# Patient Record
Sex: Male | Born: 1985
Health system: Southern US, Community
[De-identification: ages and names within clinical notes are randomized; demographics above are authoritative.]

## PROBLEM LIST (undated history)

## (undated) ENCOUNTER — Emergency Department (HOSPITAL_COMMUNITY): Admission: EM | Discharge status: Against Medical Advice | Payer: Self-pay

## (undated) DIAGNOSIS — I10 Essential (primary) hypertension: Secondary | ICD-10-CM

## (undated) DIAGNOSIS — R51 Headache: Secondary | ICD-10-CM

## (undated) DIAGNOSIS — I209 Angina pectoris, unspecified: Secondary | ICD-10-CM

## (undated) DIAGNOSIS — E0500 Thyrotoxicosis with diffuse goiter without thyrotoxic crisis or storm: Secondary | ICD-10-CM

## (undated) DIAGNOSIS — R519 Headache, unspecified: Secondary | ICD-10-CM

## (undated) DIAGNOSIS — J42 Unspecified chronic bronchitis: Secondary | ICD-10-CM

## (undated) HISTORY — PX: NO PAST SURGERIES: SHX2092

---
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
## 2011-11-03 ENCOUNTER — Emergency Department (HOSPITAL_COMMUNITY): Payer: Self-pay

## 2011-11-03 ENCOUNTER — Other Ambulatory Visit: Payer: Self-pay

## 2011-11-03 ENCOUNTER — Emergency Department (HOSPITAL_COMMUNITY)
Admission: EM | Admit: 2011-11-03 | Discharge: 2011-11-03 | Disposition: A | Discharge status: Home / Self Care | Payer: Self-pay | Attending: Emergency Medicine | Admitting: Emergency Medicine

## 2011-11-03 ENCOUNTER — Encounter (HOSPITAL_COMMUNITY): Payer: Self-pay

## 2011-11-03 DIAGNOSIS — R6889 Other general symptoms and signs: Secondary | ICD-10-CM | POA: Insufficient documentation

## 2011-11-03 DIAGNOSIS — E079 Disorder of thyroid, unspecified: Secondary | ICD-10-CM | POA: Insufficient documentation

## 2011-11-03 DIAGNOSIS — I10 Essential (primary) hypertension: Secondary | ICD-10-CM | POA: Insufficient documentation

## 2011-11-03 DIAGNOSIS — F17200 Nicotine dependence, unspecified, uncomplicated: Secondary | ICD-10-CM | POA: Insufficient documentation

## 2011-11-03 DIAGNOSIS — R071 Chest pain on breathing: Secondary | ICD-10-CM | POA: Insufficient documentation

## 2011-11-03 DIAGNOSIS — R059 Cough, unspecified: Secondary | ICD-10-CM | POA: Insufficient documentation

## 2011-11-03 DIAGNOSIS — J3489 Other specified disorders of nose and nasal sinuses: Secondary | ICD-10-CM | POA: Insufficient documentation

## 2011-11-03 DIAGNOSIS — R05 Cough: Secondary | ICD-10-CM | POA: Insufficient documentation

## 2011-11-03 MED ORDER — LORATADINE 10 MG PO TABS: 10.0000 mg | ORAL_TABLET | Freq: Every day | ORAL | Status: DC

## 2011-11-03 MED ORDER — ALBUTEROL SULFATE HFA 108 (90 BASE) MCG/ACT IN AERS
2.0000 | INHALATION_SPRAY | RESPIRATORY_TRACT | Status: DC | PRN
  Administered 2011-11-03: 2 via RESPIRATORY_TRACT
  Filled 2011-11-03: qty 6.7

## 2011-11-03 MED ORDER — KETOROLAC TROMETHAMINE 60 MG/2ML IM SOLN
60.0000 mg | Freq: Once | INTRAMUSCULAR | Status: AC
  Administered 2011-11-03: 60 mg via INTRAMUSCULAR
  Filled 2011-11-03: qty 2

## 2011-11-03 NOTE — ED Notes (Signed)
RRT annamarie made aware of pt's need

## 2011-11-03 NOTE — ED Provider Notes (Signed)
History     CSN: [id number]  Arrival date & time 11/03/11  1455   First MD Initiated Contact with Patient 11/03/11 1536      Chief Complaint  Patient presents with  . Chest Pain    (Consider location/radiation/quality/duration/timing/severity/associated sxs/prior treatment) HPI  The patient presents to the emergency department with complaints of chest pains for 1-2 years. He also complains of sneezing, nasal congestion, cough for the past week. The patient describes the chest pains are sometimes on the left sometimes on the right and sharp. He denies association of weakness, syncope, diaphoresis, shortness of breath. He denies having fevers, chills, nausea, vomiting, diarrhea.  Past Medical History  Diagnosis Date  . Thyroid disease     No past surgical history on file.  No family history on file.  History  Substance Use Topics  . Smoking status: Current Everyday Smoker -- 0.5 packs/day  . Smokeless tobacco: Not on file  . Alcohol Use: Yes     social      Review of Systems  All other systems reviewed and are negative.    Allergies  Review of patient's allergies indicates no known allergies.  Home Medications   Current Outpatient Rx  Name Route Sig Dispense Refill  . IBUPROFEN 200 MG PO TABS Oral Take 200 mg by mouth every 6 (six) hours as needed. For chest pain    . LORATADINE 10 MG PO TABS Oral Take 1 tablet (10 mg total) by mouth daily. 30 tablet 0    BP 156/109  Pulse 62  Temp(Src) 98.9 F (37.2 C) (Oral)  Resp 22  SpO2 99%  Physical Exam  Nursing note and vitals reviewed. Constitutional: He is oriented to person, place, and time. He appears well-developed and well-nourished. No distress.  HENT:  Head: Normocephalic and atraumatic.  Right Ear: External ear normal.  Left Ear: External ear normal.  Nose: Rhinorrhea present. Right sinus exhibits maxillary sinus tenderness. Left sinus exhibits maxillary sinus tenderness.  Mouth/Throat: Oropharynx is  clear and moist. No oropharyngeal exudate.  Eyes: EOM are normal. Pupils are equal, round, and reactive to light.  Neck: Normal range of motion.  Cardiovascular: Normal rate and regular rhythm.   Pulmonary/Chest: Effort normal and breath sounds normal. He has no wheezes. He has no rales. He exhibits tenderness (moderate tenderness to palpation of illustrated areas). He exhibits no bony tenderness, no laceration, no crepitus, no edema, no deformity, no swelling and no retraction.    Abdominal: Soft. Bowel sounds are normal. He exhibits no distension. There is no tenderness.  Musculoskeletal: Normal range of motion.  Neurological: He is oriented to person, place, and time.  Skin: Skin is warm and dry. He is not diaphoretic.    ED Course  Procedures (including critical care time)  Labs Reviewed - No data to display Dg Chest 2 View  11/03/2011  *RADIOLOGY REPORT*  Clinical Data: Cough, chest pain  CHEST - 2 VIEW  Comparison: None.  Findings: Borderline cardiomegaly noted.  No acute infiltrate or pleural effusion.  No pulmonary edema.  Bony thorax is unremarkable.  IMPRESSION: Borderline cardiomegaly.  No active disease.  Original Report Authenticated By: Natasha Mead, M.D.     1. Hypertension   2. Costochondral chest pain       MDM  Discussed pts blood pressure with patient. Pt has a history of Thyroid disease and hypertension. Pt has not seen a PCP in years. I am going to refer patient to a PCP for management of hypertension,  follow-up of thyroid concerns, and for continued evaluation and treatment of his chest pains.        Dorthula Matas, PA 11/03/11 1650

## 2011-11-03 NOTE — Discharge Instructions (Signed)
Hypertension Information As your heart beats, it forces blood through your arteries. This force is your blood pressure. If the pressure is too high, it is called hypertension (HTN) or high blood pressure. HTN is dangerous because you may have it and not know it. High blood pressure may mean that your heart has to work harder to pump blood. Your arteries may be narrow or stiff. The extra work puts you at risk for heart disease, stroke, and other problems.  Blood pressure consists of two numbers, a higher number over a lower, 110/72, for example. It is stated as "110 over 72." The ideal is below 120 for the top number (systolic) and under 80 for the bottom (diastolic).  You should pay close attention to your blood pressure if you have certain conditions such as:  Heart failure.   Prior heart attack.   Diabetes   Chronic kidney disease.   Prior stroke.   Multiple risk factors for heart disease.  To see if you have HTN, your blood pressure should be measured while you are seated with your arm held at the level of the heart. It should be measured at least twice. A one-time elevated blood pressure reading (especially in the Emergency Department) does not mean that you need treatment. There may be conditions in which the blood pressure is different between your right and left arms. It is important to see your caregiver soon for a recheck. Most people have essential hypertension which means that there is not a specific cause. This type of high blood pressure may be lowered by changing lifestyle factors such as:  Stress.   Smoking.   Lack of exercise.   Excessive weight.   Drug/tobacco/alcohol use.   Eating less salt.  Most people do not have symptoms from high blood pressure until it has caused damage to the body. Effective treatment can often prevent, delay or reduce that damage. TREATMENT  Treatment for high blood pressure, when a cause has been identified, is directed at the cause. There  are a large number of medications to treat HTN. These fall into several categories, and your caregiver will help you select the medicines that are best for you. Medications may have side effects. You should review side effects with your caregiver. If your blood pressure stays high after you have made lifestyle changes or started on medicines,   Your medication(s) may need to be changed.   Other problems may need to be addressed.   Be certain you understand your prescriptions, and know how and when to take your medicine.   Be sure to follow up with your caregiver within the time frame advised (usually within two weeks) to have your blood pressure rechecked and to review your medications.   If you are taking more than one medicine to lower your blood pressure, make sure you know how and at what times they should be taken. Taking two medicines at the same time can result in blood pressure that is too low.  Document Released: 10/31/2005 Document Revised: 05/10/2011 Document Reviewed: 11/07/2007 [facility] Patient Information 2012 [place], [place].Chest Pain (Nonspecific) It is often hard to give a specific diagnosis for the cause of chest pain. There is always a chance that your pain could be related to something serious, such as a heart attack or a blood clot in the lungs. You need to follow up with your caregiver for further evaluation. CAUSES   Heartburn.   Pneumonia or bronchitis.   Anxiety and stress.   Inflammation  around your heart (pericarditis) or lung (pleuritis or pleurisy).   A blood clot in the lung.   A collapsed lung (pneumothorax). It can develop suddenly on its own (spontaneous pneumothorax) or from injury (trauma) to the chest.  The chest wall is composed of bones, muscles, and cartilage. Any of these can be the source of the pain.  The bones can be bruised by injury.   The muscles or cartilage can be strained by coughing or overwork.   The cartilage can be affected by  inflammation and become sore (costochondritis).  DIAGNOSIS  Lab tests or other studies, such as X-rays, an EKG, stress testing, or cardiac imaging, may be needed to find the cause of your pain.  TREATMENT   Treatment depends on what may be causing your chest pain. Treatment may include:   Acid blockers for heartburn.   Anti-inflammatory medicine.   Pain medicine for inflammatory conditions.   Antibiotics if an infection is present.   You may be advised to change lifestyle habits. This includes stopping smoking and avoiding caffeine and chocolate.   You may be advised to keep your head raised (elevated) when sleeping. This reduces the chance of acid going backward from your stomach into your esophagus.   Most of the time, nonspecific chest pain will improve within 2 to 3 days with rest and mild pain medicine.  HOME CARE INSTRUCTIONS   If antibiotics were prescribed, take the full amount even if you start to feel better.   For the next few days, avoid physical activities that bring on chest pain. Continue physical activities as directed.   Do not smoke cigarettes or drink alcohol until your symptoms are gone.   Only take over-the-counter or prescription medicine for pain, discomfort, or fever as directed by your caregiver.   Follow your caregiver's suggestions for further testing if your chest pain does not go away.   Keep any follow-up appointments you made. If you do not go to an appointment, you could develop lasting (chronic) problems with pain. If there is any problem keeping an appointment, you must call to reschedule.  SEEK MEDICAL CARE IF:   You think you are having problems from the medicine you are taking. Read your medicine instructions carefully.   Your chest pain does not go away, even after treatment.   You develop a rash with blisters on your chest.  SEEK IMMEDIATE MEDICAL CARE IF:   You have increased chest pain or pain that spreads to your arm, neck, jaw,  back, or belly (abdomen).   You develop shortness of breath, an increasing cough, or you are coughing up blood.   You have severe back or abdominal pain, feel sick to your stomach (nauseous) or throw up (vomit).   You develop severe weakness, fainting, or chills.   You have an oral temperature above 102 F (38.9 C), not controlled by medicine.  THIS IS AN EMERGENCY. Do not wait to see if the pain will go away. Get medical help at once. Call your local emergency services (911 in [place] Do not drive yourself to the hospital. MAKE SURE YOU:   Understand these instructions.   Will watch your condition.   Will get help right away if you are not doing well or get worse.  Document Released: 06/07/2005 Document Revised: 05/10/2011 Document Reviewed: 04/02/2008 [facility] Patient Information 2012 [place], [place].

## 2011-11-03 NOTE — ED Notes (Signed)
Patient reports that he has had chestpain x 1 year that he describes as sharp in nature. Reports worse with inspiration

## 2011-11-04 NOTE — ED Provider Notes (Signed)
Medical screening examination/treatment/procedure(s) were performed by non-physician practitioner and as supervising physician I was immediately available for consultation/collaboration.  Jonelle Bann T Izic Stfort, MD 11/04/11 2027 

## 2012-09-05 ENCOUNTER — Emergency Department (HOSPITAL_COMMUNITY)
Admission: EM | Admit: 2012-09-05 | Discharge: 2012-09-06 | Discharge status: Against Medical Advice | Payer: Self-pay | Attending: Emergency Medicine | Admitting: Emergency Medicine

## 2012-09-05 ENCOUNTER — Emergency Department (HOSPITAL_COMMUNITY): Payer: Self-pay

## 2012-09-05 ENCOUNTER — Encounter (HOSPITAL_COMMUNITY): Payer: Self-pay | Admitting: Emergency Medicine

## 2012-09-05 DIAGNOSIS — S31119A Laceration without foreign body of abdominal wall, unspecified quadrant without penetration into peritoneal cavity, initial encounter: Secondary | ICD-10-CM

## 2012-09-05 DIAGNOSIS — S31109A Unspecified open wound of abdominal wall, unspecified quadrant without penetration into peritoneal cavity, initial encounter: Secondary | ICD-10-CM | POA: Insufficient documentation

## 2012-09-05 DIAGNOSIS — F17200 Nicotine dependence, unspecified, uncomplicated: Secondary | ICD-10-CM | POA: Insufficient documentation

## 2012-09-05 DIAGNOSIS — Z862 Personal history of diseases of the blood and blood-forming organs and certain disorders involving the immune mechanism: Secondary | ICD-10-CM | POA: Insufficient documentation

## 2012-09-05 DIAGNOSIS — Z8639 Personal history of other endocrine, nutritional and metabolic disease: Secondary | ICD-10-CM | POA: Insufficient documentation

## 2012-09-05 LAB — COMPREHENSIVE METABOLIC PANEL
ALT: 22 U/L (ref 0–53)
Alkaline Phosphatase: 49 U/L (ref 39–117)
BUN: 13 mg/dL (ref 6–23)
CO2: 18 mEq/L — ABNORMAL LOW (ref 19–32)
Chloride: 99 mEq/L (ref 96–112)
GFR calc Af Amer: >90 mL/min (ref >90)
GFR calc non Af Amer: 86 mL/min — ABNORMAL LOW (ref >90)
Glucose, Bld: 119 mg/dL — ABNORMAL HIGH (ref 70–99)
Potassium: 3.2 mEq/L — ABNORMAL LOW (ref 3.5–5.1)
Sodium: 139 mEq/L (ref 135–145)
Total Bilirubin: 0.7 mg/dL (ref 0.3–1.2)

## 2012-09-05 LAB — TYPE AND SCREEN
ABO/RH(D): A POS
Antibody Screen: NEGATIVE
Unit division: 0

## 2012-09-05 LAB — URINALYSIS, ROUTINE W REFLEX MICROSCOPIC
Bilirubin Urine: NEGATIVE
Ketones, ur: NEGATIVE mg/dL
Nitrite: NEGATIVE
Specific Gravity, Urine: 1.012 (ref 1.005–1.030)
Urobilinogen, UA: 0.2 mg/dL (ref 0.0–1.0)
pH: 6 (ref 5.0–8.0)

## 2012-09-05 LAB — URINE MICROSCOPIC-ADD ON

## 2012-09-05 LAB — CBC
MCHC: 35.2 g/dL (ref 30.0–36.0)
Platelets: 209 10*3/uL (ref 150–400)
RDW: 13.2 % (ref 11.5–15.5)
WBC: 5 10*3/uL (ref 4.0–10.5)

## 2012-09-05 LAB — PROTIME-INR: INR: 1.01 (ref 0.00–1.49)

## 2012-09-05 LAB — POCT I-STAT, CHEM 8
Calcium, Ion: 1.1 mmol/L — ABNORMAL LOW (ref 1.12–1.23)
Chloride: 106 mEq/L (ref 96–112)
Creatinine, Ser: 1.4 mg/dL — ABNORMAL HIGH (ref 0.50–1.35)
Glucose, Bld: 119 mg/dL — ABNORMAL HIGH (ref 70–99)
HCT: 45 % (ref 39.0–52.0)
Potassium: 3.3 mEq/L — ABNORMAL LOW (ref 3.5–5.1)

## 2012-09-05 LAB — ABO/RH: ABO/RH(D): A POS

## 2012-09-05 LAB — ETHANOL: Alcohol, Ethyl (B): 82 mg/dL — ABNORMAL HIGH (ref 0–11)

## 2012-09-05 MED ORDER — HYDROCODONE-ACETAMINOPHEN 5-325 MG PO TABS: 1.0000 | ORAL_TABLET | ORAL | Status: DC | PRN

## 2012-09-05 MED ORDER — HYDROCODONE-ACETAMINOPHEN 5-325 MG PO TABS
2.0000 | ORAL_TABLET | Freq: Once | ORAL | Status: AC
  Administered 2012-09-06: 2 via ORAL
  Filled 2012-09-05: qty 2

## 2012-09-05 MED ORDER — IBUPROFEN 600 MG PO TABS: 600.0000 mg | ORAL_TABLET | Freq: Four times a day (QID) | ORAL | Status: DC | PRN

## 2012-09-05 MED ORDER — LACTATED RINGERS IV BOLUS (SEPSIS)
2000.0000 mL | Freq: Once | INTRAVENOUS | Status: AC
  Administered 2012-09-05: 1000 mL via INTRAVENOUS

## 2012-09-05 MED ORDER — MORPHINE SULFATE 4 MG/ML IJ SOLN: 4.0000 mg | Freq: Once | INTRAMUSCULAR | Status: DC

## 2012-09-05 MED ORDER — CEFAZOLIN SODIUM 1-5 GM-% IV SOLN
1.0000 g | Freq: Once | INTRAVENOUS | Status: AC
  Administered 2012-09-05: 1 g via INTRAVENOUS
  Filled 2012-09-05: qty 50

## 2012-09-05 MED ORDER — SODIUM CHLORIDE 0.9 % IV BOLUS (SEPSIS)
1000.0000 mL | Freq: Once | INTRAVENOUS | Status: AC
  Administered 2012-09-05: 1000 mL via INTRAVENOUS

## 2012-09-05 MED ORDER — MORPHINE SULFATE 4 MG/ML IJ SOLN
4.0000 mg | Freq: Once | INTRAMUSCULAR | Status: AC
  Administered 2012-09-05: 4 mg via INTRAVENOUS
  Filled 2012-09-05: qty 1

## 2012-09-05 MED ORDER — IOHEXOL 300 MG/ML  SOLN
80.0000 mL | Freq: Once | INTRAMUSCULAR | Status: AC | PRN
  Administered 2012-09-05: 80 mL via INTRAVENOUS

## 2012-09-05 MED ORDER — FENTANYL CITRATE 0.05 MG/ML IJ SOLN: 50.0000 ug | Freq: Once | INTRAMUSCULAR | Status: DC

## 2012-09-05 MED ORDER — FENTANYL CITRATE 0.05 MG/ML IJ SOLN
INTRAMUSCULAR | Status: AC
  Filled 2012-09-05: qty 2

## 2012-09-05 MED ORDER — FENTANYL CITRATE 0.05 MG/ML IJ SOLN
INTRAMUSCULAR | Status: AC | PRN
  Administered 2012-09-05: 50 ug via INTRAVENOUS

## 2012-09-05 MED ORDER — CEPHALEXIN 500 MG PO CAPS: 500.0000 mg | ORAL_CAPSULE | Freq: Four times a day (QID) | ORAL | Status: DC

## 2012-09-05 NOTE — ED Notes (Signed)
Pt requested Nurse to call family member to let them know he is in the ED

## 2012-09-05 NOTE — ED Notes (Signed)
Family contacted. Per pt request, informed pt was in the ED

## 2012-09-05 NOTE — ED Notes (Signed)
Wound irrigated and redressed.
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
## 2012-09-05 NOTE — ED Notes (Signed)
Pt up walking around in room, states he is wanting to leave. MD notified. MD now in room explaining to patient the risk of leaving against medical advice. Pt instructed of the of the risks and benefits of leaving against medical advice. Pt states he understands.
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
## 2012-09-05 NOTE — ED Notes (Signed)
Family called, no answer.
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
## 2013-06-14 ENCOUNTER — Emergency Department (HOSPITAL_COMMUNITY)
Admission: EM | Admit: 2013-06-14 | Discharge: 2013-06-14 | Disposition: A | Discharge status: Home / Self Care | Payer: Self-pay | Attending: Emergency Medicine | Admitting: Emergency Medicine

## 2013-06-14 ENCOUNTER — Emergency Department (HOSPITAL_COMMUNITY): Payer: Self-pay

## 2013-06-14 ENCOUNTER — Encounter (HOSPITAL_COMMUNITY): Payer: Self-pay | Admitting: *Deleted

## 2013-06-14 DIAGNOSIS — I10 Essential (primary) hypertension: Secondary | ICD-10-CM | POA: Insufficient documentation

## 2013-06-14 DIAGNOSIS — F17200 Nicotine dependence, unspecified, uncomplicated: Secondary | ICD-10-CM | POA: Insufficient documentation

## 2013-06-14 DIAGNOSIS — Z8639 Personal history of other endocrine, nutritional and metabolic disease: Secondary | ICD-10-CM | POA: Insufficient documentation

## 2013-06-14 DIAGNOSIS — S0101XA Laceration without foreign body of scalp, initial encounter: Secondary | ICD-10-CM

## 2013-06-14 DIAGNOSIS — S060X9A Concussion with loss of consciousness of unspecified duration, initial encounter: Secondary | ICD-10-CM | POA: Insufficient documentation

## 2013-06-14 DIAGNOSIS — S0100XA Unspecified open wound of scalp, initial encounter: Secondary | ICD-10-CM | POA: Insufficient documentation

## 2013-06-14 DIAGNOSIS — S0990XA Unspecified injury of head, initial encounter: Secondary | ICD-10-CM

## 2013-06-14 DIAGNOSIS — Z862 Personal history of diseases of the blood and blood-forming organs and certain disorders involving the immune mechanism: Secondary | ICD-10-CM | POA: Insufficient documentation

## 2013-06-14 HISTORY — DX: Essential (primary) hypertension: I10

## 2013-06-14 MED ORDER — MORPHINE SULFATE 4 MG/ML IJ SOLN
6.0000 mg | INTRAMUSCULAR | Status: AC
  Administered 2013-06-14: 6 mg via INTRAMUSCULAR
  Filled 2013-06-14 (×2): qty 2

## 2013-06-14 MED ORDER — HYDROCODONE-ACETAMINOPHEN 5-325 MG PO TABS
2.0000 | ORAL_TABLET | Freq: Once | ORAL | Status: AC
  Administered 2013-06-14: 2 via ORAL
  Filled 2013-06-14: qty 2

## 2013-06-14 MED ORDER — LIDOCAINE HCL (PF) 1 % IJ SOLN
30.0000 mL | Freq: Once | INTRAMUSCULAR | Status: DC
  Filled 2013-06-14: qty 30

## 2013-06-14 MED ORDER — HYDROCODONE-ACETAMINOPHEN 5-325 MG PO TABS: 2.0000 | ORAL_TABLET | ORAL | Status: DC | PRN
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
## 2013-07-07 ENCOUNTER — Encounter (HOSPITAL_COMMUNITY): Payer: Self-pay | Admitting: Emergency Medicine

## 2013-07-07 ENCOUNTER — Emergency Department (HOSPITAL_COMMUNITY)
Admission: EM | Admit: 2013-07-07 | Discharge: 2013-07-07 | Disposition: A | Discharge status: Home / Self Care | Payer: Self-pay | Attending: Emergency Medicine | Admitting: Emergency Medicine

## 2013-07-07 DIAGNOSIS — Z8639 Personal history of other endocrine, nutritional and metabolic disease: Secondary | ICD-10-CM | POA: Insufficient documentation

## 2013-07-07 DIAGNOSIS — Z4802 Encounter for removal of sutures: Secondary | ICD-10-CM | POA: Insufficient documentation

## 2013-07-07 DIAGNOSIS — I10 Essential (primary) hypertension: Secondary | ICD-10-CM | POA: Insufficient documentation

## 2013-07-07 DIAGNOSIS — Z862 Personal history of diseases of the blood and blood-forming organs and certain disorders involving the immune mechanism: Secondary | ICD-10-CM | POA: Insufficient documentation

## 2013-07-07 DIAGNOSIS — F17200 Nicotine dependence, unspecified, uncomplicated: Secondary | ICD-10-CM | POA: Insufficient documentation

## 2013-07-07 MED ORDER — IBUPROFEN 400 MG PO TABS
800.0000 mg | ORAL_TABLET | Freq: Once | ORAL | Status: AC
  Administered 2013-07-07: 800 mg via ORAL
  Filled 2013-07-07: qty 2

## 2013-07-07 NOTE — ED Notes (Signed)
Patient just needing suture removal.
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
## 2013-07-08 NOTE — ED Provider Notes (Signed)
Medical screening examination/treatment/procedure(s) were performed by non-physician practitioner and as supervising physician I was immediately available for consultation/collaboration.  EKG Interpretation   None         Shelda Jakes, MD 07/08/13 [phone]

## 2015-02-25 ENCOUNTER — Encounter (HOSPITAL_COMMUNITY): Payer: Self-pay | Admitting: Physical Medicine and Rehabilitation

## 2015-02-25 ENCOUNTER — Emergency Department (HOSPITAL_COMMUNITY)
Admission: EM | Admit: 2015-02-25 | Discharge: 2015-02-25 | Disposition: A | Discharge status: Home / Self Care | Payer: Self-pay | Attending: Emergency Medicine | Admitting: Emergency Medicine

## 2015-02-25 DIAGNOSIS — I10 Essential (primary) hypertension: Secondary | ICD-10-CM | POA: Insufficient documentation

## 2015-02-25 DIAGNOSIS — S20469A Insect bite (nonvenomous) of unspecified back wall of thorax, initial encounter: Secondary | ICD-10-CM | POA: Insufficient documentation

## 2015-02-25 DIAGNOSIS — Z720 Tobacco use: Secondary | ICD-10-CM | POA: Insufficient documentation

## 2015-02-25 DIAGNOSIS — S60561A Insect bite (nonvenomous) of right hand, initial encounter: Secondary | ICD-10-CM | POA: Insufficient documentation

## 2015-02-25 DIAGNOSIS — Y9389 Activity, other specified: Secondary | ICD-10-CM | POA: Insufficient documentation

## 2015-02-25 DIAGNOSIS — S30861A Insect bite (nonvenomous) of abdominal wall, initial encounter: Secondary | ICD-10-CM | POA: Insufficient documentation

## 2015-02-25 DIAGNOSIS — Y9289 Other specified places as the place of occurrence of the external cause: Secondary | ICD-10-CM | POA: Insufficient documentation

## 2015-02-25 DIAGNOSIS — S90861A Insect bite (nonvenomous), right foot, initial encounter: Secondary | ICD-10-CM | POA: Insufficient documentation

## 2015-02-25 DIAGNOSIS — S90561A Insect bite (nonvenomous), right ankle, initial encounter: Secondary | ICD-10-CM | POA: Insufficient documentation

## 2015-02-25 DIAGNOSIS — Y998 Other external cause status: Secondary | ICD-10-CM | POA: Insufficient documentation

## 2015-02-25 DIAGNOSIS — W57XXXA Bitten or stung by nonvenomous insect and other nonvenomous arthropods, initial encounter: Secondary | ICD-10-CM | POA: Insufficient documentation

## 2015-02-25 DIAGNOSIS — S60562A Insect bite (nonvenomous) of left hand, initial encounter: Secondary | ICD-10-CM | POA: Insufficient documentation

## 2015-02-25 DIAGNOSIS — Z8639 Personal history of other endocrine, nutritional and metabolic disease: Secondary | ICD-10-CM | POA: Insufficient documentation

## 2015-02-25 DIAGNOSIS — S90862A Insect bite (nonvenomous), left foot, initial encounter: Secondary | ICD-10-CM | POA: Insufficient documentation

## 2015-02-25 DIAGNOSIS — S90562A Insect bite (nonvenomous), left ankle, initial encounter: Secondary | ICD-10-CM | POA: Insufficient documentation

## 2015-02-25 MED ORDER — HYDROXYZINE HCL 25 MG PO TABS: 25.0000 mg | ORAL_TABLET | Freq: Four times a day (QID) | ORAL | Status: DC

## 2015-02-25 MED ORDER — PERMETHRIN 5 % EX CREA: TOPICAL_CREAM | CUTANEOUS | Status: DC

## 2015-02-25 MED ORDER — HYDROXYZINE HCL 25 MG PO TABS
50.0000 mg | ORAL_TABLET | Freq: Once | ORAL | Status: AC
  Administered 2015-02-25: 50 mg via ORAL
  Filled 2015-02-25: qty 2

## 2015-02-25 MED ORDER — DEXAMETHASONE SODIUM PHOSPHATE 10 MG/ML IJ SOLN
10.0000 mg | Freq: Once | INTRAMUSCULAR | Status: AC
  Administered 2015-02-25: 10 mg via INTRAMUSCULAR
  Filled 2015-02-25: qty 1

## 2015-02-25 NOTE — ED Notes (Signed)
Pt presents to department for evaluation of rash to entire body. Reports he recently just moved into new apartment, concerned for bed bugs. NAD

## 2015-02-25 NOTE — ED Provider Notes (Signed)
CSN: [id number]     Arrival date & time 02/25/15  1237 History   This chart is scribed for non-physician practitioner, Jaynie Crumble, PA-C, working with Vanetta Mulders, MD by Abel Presto, ED Scribe.  This patient was seen in room TR08C/TR08C and the patient's care was started 1:21 PM.     Chief Complaint  Patient presents with  . Insect Bite     The history is provided by the patient. No language interpreter was used.   HPI Comments: Darren Barajas is a 29 y.o. male who presents to the Emergency Department complaining of pruritic rash to generalized body especially back, bilateral extremities and in between fingers.  Pt states he moved into a new apartment and slept on floor as he had not moved his furniture yet. Pt states he thinks he saw bed bugs. He reports associated swelling to ankles where a large number of bites are concentrated and generalized weakness.  He tried OTC cream for relief. He denies fever and chills.  Past Medical History  Diagnosis Date  . Thyroid disease   . Hypertension    History reviewed. No pertinent past surgical history. History reviewed. No pertinent family history. History  Substance Use Topics  . Smoking status: Current Every Day Smoker -- 0.50 packs/day    Types: Cigarettes  . Smokeless tobacco: Not on file  . Alcohol Use: Yes     Comment: social    Review of Systems  Constitutional: Negative for fever and chills.  Skin: Positive for rash.      Allergies  Review of patient's allergies indicates no known allergies.  Home Medications   Prior to Admission medications   Medication Sig Start Date End Date Taking? Authorizing Provider  HYDROcodone-acetaminophen (NORCO) 5-325 MG per tablet Take 2 tablets by mouth every 4 (four) hours as needed for pain. 06/14/13   Blane Ohara, MD   BP 164/101 mmHg  Pulse 82  Temp(Src) 98.4 F (36.9 C) (Oral)  Resp 18  SpO2 95% Physical Exam  Constitutional: He is oriented to person, place,  and time. He appears well-developed and well-nourished.  HENT:  Head: Normocephalic.  Eyes: Conjunctivae are normal.  Neck: Normal range of motion. Neck supple.  Pulmonary/Chest: Effort normal.  Musculoskeletal: Normal range of motion.  Neurological: He is alert and oriented to person, place, and time.  Skin: Skin is warm and dry.  Multiple erythematous papules to bilateral hands, feet, ankles, lower abdomen, lower back, bilateral flank. Multiple excoriations  Psychiatric: He has a normal mood and affect. His behavior is normal.  Nursing note and vitals reviewed.   ED Course  Procedures (including critical care time) DIAGNOSTIC STUDIES: Oxygen Saturation is 95% on room air, normal by my interpretation.    COORDINATION OF CARE: 1:25 PM Discussed treatment plan with patient at beside, the patient agrees with the plan and has no further questions at this time.   Labs Review Labs Reviewed - No data to display  Imaging Review No results found.   EKG Interpretation None      MDM   Final diagnoses:  Insect bites   Pt with multiple bug bites. Most likely bed bugs after sleeping in new apt. Pt already got rid of all the clothes he had there, pillow, sheets. Will give permethrin for possible scabies. Home with vistaril for itching. Pt given decadron in ed im because of severe itching.   Filed Vitals:   02/25/15 1249 02/25/15 1344  BP: 164/101 166/99  Pulse: 82 53  Temp: 98.4 F (36.9 C) 98.4 F (36.9 C)  TempSrc: Oral   Resp: 18 16  SpO2: 95% 100%   I personally performed the services described in this documentation, which was scribed in my presence. The recorded information has been reviewed and is accurate.   Jaynie Crumble, PA-C 02/25/15 1354  Vanetta Mulders, MD 02/26/15 [phone]

## 2015-02-25 NOTE — Discharge Instructions (Signed)
Apply topical benadryl. Take hydroxyzine for itching. Make sure to get rid of any clothes that you had on at new apt. Follow up with your doctor as needed   Bedbugs Bedbugs are tiny bugs that live in and around beds. During the day, they hide in mattresses and other places near beds. They come out at night and bite people lying in bed. They need blood to live and grow. Bedbugs can be found in beds anywhere. Usually, they are found in places where many people come and go (hotels, shelters, hospitals). It does not matter whether the place is dirty or clean. Getting bitten by bedbugs rarely causes a medical problem. The biggest problem can be getting rid of them. This often takes the work of a Oncologist. CAUSES  Less use of pesticides. Bedbugs were common before the 1950s. Then, strong pesticides such as DDT nearly wiped them out. Today, these pesticides are not used because they harm the environment and can cause health problems.  More travel. Besides mattresses, bedbugs can also live in clothing and luggage. They can come along as people travel from place to place. Bedbugs are more common in certain parts of the world. When people travel to those areas, the bugs can come home with them.  Presence of birds and bats. Bedbugs often infest birds and bats. If you have these animals in or near your home, bedbugs may infest your house, too. SYMPTOMS It does not hurt to be bitten by a bedbug. You will probably not wake up when you are bitten. Bedbugs usually bite areas of the skin that are not covered. Symptoms may show when you wake up, or they may take a day or more to show up. Symptoms may include:  Small red bumps on the skin. These might be lined up in a row or clustered in a group.  A darker red dot in the middle of red bumps.  Blisters on the skin. There may be swelling and very bad itching. These may be signs of an allergic reaction. This does not happen often. DIAGNOSIS Bedbug bites  might look and feel like other types of insect bites. The bugs do not stay on the body like ticks or lice. They bite, drop off, and crawl away to hide. Your caregiver will probably:  Ask about your symptoms.  Ask about your recent activities and travel.  Check your skin for bedbug bites.  Ask you to check at home for signs of bedbugs. You should look for:  Spots or stains on the bed or nearby. This could be from bedbugs that were crushed or from their eggs or waste.  Bedbugs themselves. They are reddish-brown, oval, and flat. They do not fly. They are about the size of an apple seed.  Places to look for bedbugs include:  Beds. Check mattresses, headboards, box springs, and bed frames.  On drapes and curtains near the bed.  Under carpeting in the bedroom.  Behind electrical outlets.  Behind any wallpaper that is peeling.  Inside luggage. TREATMENT Most bedbug bites do not need treatment. They usually go away on their own in a few days. The bites are not dangerous. However, treatment may be needed if you have scratched so much that your skin has become infected. You may also need treatment if you are allergic to bedbug bites. Treatment options include:  A drug that stops swelling and itching (corticosteroid). Usually, a cream is rubbed on the skin. If you have a bad rash, you  may be given a corticosteroid pill.  Oral antihistamines. These are pills to help control itching.  Antibiotic medicines. An antibiotic may be prescribed for infected skin. HOME CARE INSTRUCTIONS   Take any medicine prescribed by your caregiver for your bites. Follow the directions carefully.  Consider wearing pajamas with long sleeves and pant legs.  Your bedroom may need to be treated. A pest control expert should make sure the bedbugs are gone. You may need to throw away mattresses or luggage. Ask the pest control expert what you can do to keep the bedbugs from coming back. Common suggestions  include:  Putting a plastic cover over your mattress.  Washing and drying your clothes and bedding in hot water and a hot dryer. The temperature should be hotter than 120 F (48.9 C). Bedbugs are killed by high temperatures.  Vacuuming carefully all around your bed. Vacuum in all cracks and crevices where the bugs might hide. Do this often.  Carefully checking all used furniture, bedding, or clothes that you bring into your house.  Eliminating bird nests and bat roosts.  If you get bedbug bites when traveling, check all your possessions carefully before bringing them into your house. If you find any bugs on clothes or in your luggage, consider throwing those items away. SEEK MEDICAL CARE IF:  You have red bug bites that keep coming back.  You have red bug bites that itch badly.  You have bug bites that cause a skin rash.  You have scratch marks that are red and sore. SEEK IMMEDIATE MEDICAL CARE IF: You have a fever. Document Released: 09/30/2010 Document Revised: 11/20/2011 Document Reviewed: 09/30/2010 [facility] Patient Information 2015 [place], [place]. This information is not intended to replace advice given to you by your health care provider. Make sure you discuss any questions you have with your health care provider.

## 2015-04-07 ENCOUNTER — Encounter (HOSPITAL_COMMUNITY): Payer: Self-pay

## 2015-04-07 ENCOUNTER — Emergency Department (HOSPITAL_COMMUNITY): Payer: Self-pay

## 2015-04-07 ENCOUNTER — Inpatient Hospital Stay (HOSPITAL_COMMUNITY)
Admission: EM | Admit: 2015-04-07 | Discharge: 2015-04-08 | DRG: 641 | Disposition: A | Discharge status: Home / Self Care | Payer: Self-pay | Attending: Internal Medicine | Admitting: Internal Medicine

## 2015-04-07 DIAGNOSIS — S8001XA Contusion of right knee, initial encounter: Secondary | ICD-10-CM | POA: Diagnosis present

## 2015-04-07 DIAGNOSIS — Y929 Unspecified place or not applicable: Secondary | ICD-10-CM

## 2015-04-07 DIAGNOSIS — E8729 Other acidosis: Secondary | ICD-10-CM

## 2015-04-07 DIAGNOSIS — T43624A Poisoning by amphetamines, undetermined, initial encounter: Secondary | ICD-10-CM | POA: Diagnosis present

## 2015-04-07 DIAGNOSIS — F15929 Other stimulant use, unspecified with intoxication, unspecified: Secondary | ICD-10-CM

## 2015-04-07 DIAGNOSIS — X58XXXA Exposure to other specified factors, initial encounter: Secondary | ICD-10-CM

## 2015-04-07 DIAGNOSIS — E034 Atrophy of thyroid (acquired): Secondary | ICD-10-CM | POA: Diagnosis present

## 2015-04-07 DIAGNOSIS — Y905 Blood alcohol level of 100-119 mg/100 ml: Secondary | ICD-10-CM | POA: Diagnosis present

## 2015-04-07 DIAGNOSIS — E890 Postprocedural hypothyroidism: Secondary | ICD-10-CM | POA: Diagnosis present

## 2015-04-07 DIAGNOSIS — Y9302 Activity, running: Secondary | ICD-10-CM

## 2015-04-07 DIAGNOSIS — W010XXA Fall on same level from slipping, tripping and stumbling without subsequent striking against object, initial encounter: Secondary | ICD-10-CM | POA: Diagnosis present

## 2015-04-07 DIAGNOSIS — Z87448 Personal history of other diseases of urinary system: Secondary | ICD-10-CM | POA: Diagnosis present

## 2015-04-07 DIAGNOSIS — F1910 Other psychoactive substance abuse, uncomplicated: Secondary | ICD-10-CM | POA: Insufficient documentation

## 2015-04-07 DIAGNOSIS — F10129 Alcohol abuse with intoxication, unspecified: Secondary | ICD-10-CM | POA: Diagnosis present

## 2015-04-07 DIAGNOSIS — E860 Dehydration: Secondary | ICD-10-CM | POA: Diagnosis present

## 2015-04-07 DIAGNOSIS — F1610 Hallucinogen abuse, uncomplicated: Secondary | ICD-10-CM | POA: Diagnosis present

## 2015-04-07 DIAGNOSIS — F17210 Nicotine dependence, cigarettes, uncomplicated: Secondary | ICD-10-CM | POA: Diagnosis present

## 2015-04-07 DIAGNOSIS — Z8679 Personal history of other diseases of the circulatory system: Secondary | ICD-10-CM

## 2015-04-07 DIAGNOSIS — R000 Tachycardia, unspecified: Secondary | ICD-10-CM | POA: Insufficient documentation

## 2015-04-07 DIAGNOSIS — E872 Acidosis, unspecified: Secondary | ICD-10-CM | POA: Diagnosis present

## 2015-04-07 DIAGNOSIS — N179 Acute kidney failure, unspecified: Secondary | ICD-10-CM | POA: Diagnosis present

## 2015-04-07 DIAGNOSIS — I10 Essential (primary) hypertension: Secondary | ICD-10-CM | POA: Diagnosis present

## 2015-04-07 HISTORY — DX: Angina pectoris, unspecified: I20.9

## 2015-04-07 HISTORY — DX: Headache, unspecified: R51.9

## 2015-04-07 HISTORY — DX: Headache: R51

## 2015-04-07 HISTORY — DX: Thyrotoxicosis with diffuse goiter without thyrotoxic crisis or storm: E05.00

## 2015-04-07 HISTORY — DX: Unspecified chronic bronchitis: J42

## 2015-04-07 LAB — URINE MICROSCOPIC-ADD ON

## 2015-04-07 LAB — CBC WITH DIFFERENTIAL/PLATELET
BASOS ABS: 0 10*3/uL (ref 0.0–0.1)
Basophils Relative: 0 % (ref 0–1)
Eosinophils Absolute: 0.1 10*3/uL (ref 0.0–0.7)
Eosinophils Relative: 1 % (ref 0–5)
HEMATOCRIT: 45.7 % (ref 39.0–52.0)
HEMOGLOBIN: 14.9 g/dL (ref 13.0–17.0)
LYMPHS ABS: 5.5 10*3/uL — AB (ref 0.7–4.0)
LYMPHS PCT: 46 % (ref 12–46)
MCH: 32.2 pg (ref 26.0–34.0)
MCHC: 32.6 g/dL (ref 30.0–36.0)
MCV: 98.7 fL (ref 78.0–100.0)
MONOS PCT: 10 % (ref 3–12)
Monocytes Absolute: 1.1 10*3/uL — ABNORMAL HIGH (ref 0.1–1.0)
NEUTROS ABS: 5.1 10*3/uL (ref 1.7–7.7)
NEUTROS PCT: 43 % (ref 43–77)
PLATELETS: 241 10*3/uL (ref 150–400)
RBC: 4.63 MIL/uL (ref 4.22–5.81)
RDW: 12.9 % (ref 11.5–15.5)
WBC: 11.8 10*3/uL — ABNORMAL HIGH (ref 4.0–10.5)

## 2015-04-07 LAB — URINALYSIS, ROUTINE W REFLEX MICROSCOPIC
BILIRUBIN URINE: NEGATIVE
GLUCOSE, UA: NEGATIVE mg/dL
KETONES UR: NEGATIVE mg/dL
Leukocytes, UA: NEGATIVE
NITRITE: NEGATIVE
PH: 5 (ref 5.0–8.0)
Protein, ur: >300 mg/dL — AB
Specific Gravity, Urine: 1.011 (ref 1.005–1.030)
Urobilinogen, UA: 0.2 mg/dL (ref 0.0–1.0)

## 2015-04-07 LAB — COMPREHENSIVE METABOLIC PANEL
ALBUMIN: 4.7 g/dL (ref 3.5–5.0)
ALT: 32 U/L (ref 17–63)
AST: 50 U/L — ABNORMAL HIGH (ref 15–41)
Alkaline Phosphatase: 61 U/L (ref 38–126)
Anion gap: 33 — ABNORMAL HIGH (ref 5–15)
BUN: 14 mg/dL (ref 6–20)
CALCIUM: 9.4 mg/dL (ref 8.9–10.3)
CHLORIDE: 100 mmol/L — AB (ref 101–111)
CO2: 6 mmol/L — AB (ref 22–32)
CREATININE: 2.08 mg/dL — AB (ref 0.61–1.24)
GFR calc non Af Amer: 42 mL/min — ABNORMAL LOW (ref >60)
GFR, EST AFRICAN AMERICAN: 49 mL/min — AB (ref >60)
Glucose, Bld: 145 mg/dL — ABNORMAL HIGH (ref 65–99)
Potassium: 4.2 mmol/L (ref 3.5–5.1)
Sodium: 139 mmol/L (ref 135–145)
TOTAL PROTEIN: 8.5 g/dL — AB (ref 6.5–8.1)
Total Bilirubin: 1.6 mg/dL — ABNORMAL HIGH (ref 0.3–1.2)

## 2015-04-07 LAB — I-STAT CHEM 8, ED
BUN: 15 mg/dL (ref 6–20)
CHLORIDE: 105 mmol/L (ref 101–111)
Calcium, Ion: 1.16 mmol/L (ref 1.12–1.23)
Creatinine, Ser: 2.1 mg/dL — ABNORMAL HIGH (ref 0.61–1.24)
Glucose, Bld: 137 mg/dL — ABNORMAL HIGH (ref 65–99)
HEMATOCRIT: 50 % (ref 39.0–52.0)
HEMOGLOBIN: 17 g/dL (ref 13.0–17.0)
POTASSIUM: 4.2 mmol/L (ref 3.5–5.1)
SODIUM: 139 mmol/L (ref 135–145)
TCO2: 6 mmol/L (ref 0–100)

## 2015-04-07 LAB — RAPID URINE DRUG SCREEN, HOSP PERFORMED
Amphetamines: NOT DETECTED
BARBITURATES: NOT DETECTED
Benzodiazepines: POSITIVE — AB
Cocaine: NOT DETECTED
OPIATES: NOT DETECTED
TETRAHYDROCANNABINOL: NOT DETECTED

## 2015-04-07 LAB — I-STAT CG4 LACTIC ACID, ED: Lactic Acid, Venous: 5.59 mmol/L (ref 0.5–2.0)

## 2015-04-07 LAB — ACETAMINOPHEN LEVEL

## 2015-04-07 LAB — I-STAT TROPONIN, ED: Troponin i, poc: 0.03 ng/mL (ref 0.00–0.08)

## 2015-04-07 LAB — SALICYLATE LEVEL

## 2015-04-07 LAB — T4, FREE: Free T4: 0.53 ng/dL — ABNORMAL LOW (ref 0.61–1.12)

## 2015-04-07 LAB — TROPONIN I: Troponin I: 0.03 ng/mL (ref <0.031)

## 2015-04-07 LAB — ETHANOL: Alcohol, Ethyl (B): 110 mg/dL — ABNORMAL HIGH (ref <5)

## 2015-04-07 LAB — LACTIC ACID, PLASMA: LACTIC ACID, VENOUS: 0.8 mmol/L (ref 0.5–2.0)

## 2015-04-07 LAB — CK: Total CK: 892 U/L — ABNORMAL HIGH (ref 49–397)

## 2015-04-07 LAB — TSH: TSH: 12.204 u[IU]/mL — AB (ref 0.350–4.500)

## 2015-04-07 MED ORDER — SODIUM CHLORIDE 0.9 % IJ SOLN
3.0000 mL | Freq: Two times a day (BID) | INTRAMUSCULAR | Status: DC
  Administered 2015-04-07 – 2015-04-08 (×2): 3 mL via INTRAVENOUS

## 2015-04-07 MED ORDER — LORAZEPAM 2 MG/ML IJ SOLN
1.0000 mg | Freq: Once | INTRAMUSCULAR | Status: AC
  Administered 2015-04-07: 1 mg via INTRAVENOUS
  Filled 2015-04-07: qty 1

## 2015-04-07 MED ORDER — ACETAMINOPHEN 650 MG RE SUPP: 650.0000 mg | Freq: Four times a day (QID) | RECTAL | Status: DC | PRN

## 2015-04-07 MED ORDER — SODIUM CHLORIDE 0.9 % IV BOLUS (SEPSIS)
1000.0000 mL | Freq: Once | INTRAVENOUS | Status: AC
  Administered 2015-04-07: 1000 mL via INTRAVENOUS

## 2015-04-07 MED ORDER — HEPARIN SODIUM (PORCINE) 5000 UNIT/ML IJ SOLN
5000.0000 [IU] | Freq: Three times a day (TID) | INTRAMUSCULAR | Status: DC
  Administered 2015-04-07 – 2015-04-08 (×3): 5000 [IU] via SUBCUTANEOUS
  Filled 2015-04-07 (×5): qty 1

## 2015-04-07 MED ORDER — SODIUM CHLORIDE 0.9 % IV SOLN
INTRAVENOUS | Status: DC
  Administered 2015-04-07 (×2): 1000 mL via INTRAVENOUS
  Administered 2015-04-07: 10:00:00 via INTRAVENOUS

## 2015-04-07 MED ORDER — SODIUM CHLORIDE 0.9 % IV SOLN
INTRAVENOUS | Status: DC
  Administered 2015-04-07: 125 mL/h via INTRAVENOUS

## 2015-04-07 MED ORDER — SODIUM CHLORIDE 0.9 % IV BOLUS (SEPSIS)
1000.0000 mL | Freq: Once | INTRAVENOUS | Status: AC
  Administered 2015-04-07 (×2): 1000 mL via INTRAVENOUS

## 2015-04-07 MED ORDER — ACETAMINOPHEN 325 MG PO TABS
650.0000 mg | ORAL_TABLET | Freq: Four times a day (QID) | ORAL | Status: DC | PRN
  Administered 2015-04-07: 650 mg via ORAL
  Filled 2015-04-07: qty 2

## 2015-04-07 MED ORDER — OXYCODONE HCL 5 MG PO TABS
5.0000 mg | ORAL_TABLET | Freq: Once | ORAL | Status: AC
Start: 2015-04-07 — End: 2015-04-07
  Administered 2015-04-07: 5 mg via ORAL
  Filled 2015-04-07: qty 1

## 2015-04-07 MED ORDER — TRAMADOL HCL 50 MG PO TABS
50.0000 mg | ORAL_TABLET | Freq: Four times a day (QID) | ORAL | Status: DC | PRN
  Administered 2015-04-07 – 2015-04-08 (×2): 50 mg via ORAL
  Filled 2015-04-07 (×2): qty 1

## 2015-04-07 NOTE — Progress Notes (Signed)
At 1415 pt arrived to floor from the ED via stretcher.  A&0x4. Call bell & phone at reach .  Instructed to call for assistance as needed.  Verbalized understanding.  Amanda Pea, Charity fundraiser.
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
## 2015-04-07 NOTE — ED Notes (Signed)
Patient transported to CT via stretcher.
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
## 2015-04-07 NOTE — ED Notes (Signed)
Dr. Ward at the bedside.  
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
## 2015-04-07 NOTE — ED Notes (Signed)
Admitting MD at the bedside.  

## 2015-04-07 NOTE — ED Notes (Signed)
Pt transported to CT at this time.

## 2015-04-08 LAB — BASIC METABOLIC PANEL
Anion gap: 6 (ref 5–15)
BUN: 7 mg/dL (ref 6–20)
CALCIUM: 8.7 mg/dL — AB (ref 8.9–10.3)
CO2: 21 mmol/L — AB (ref 22–32)
Chloride: 111 mmol/L (ref 101–111)
Creatinine, Ser: 0.91 mg/dL (ref 0.61–1.24)
GFR calc Af Amer: >60 mL/min (ref >60)
GLUCOSE: 85 mg/dL (ref 65–99)
POTASSIUM: 3.8 mmol/L (ref 3.5–5.1)
SODIUM: 138 mmol/L (ref 135–145)

## 2015-04-08 LAB — T3, FREE: T3, Free: 3.2 pg/mL (ref 2.0–4.4)

## 2015-04-08 MED ORDER — LEVOTHYROXINE SODIUM 50 MCG PO TABS: 50.0000 ug | ORAL_TABLET | Freq: Every day | ORAL | Status: DC

## 2015-04-08 MED ORDER — LEVOTHYROXINE SODIUM 50 MCG PO TABS
50.0000 ug | ORAL_TABLET | Freq: Every day | ORAL | Status: DC
  Administered 2015-04-08: 50 ug via ORAL
  Filled 2015-04-08 (×2): qty 1

## 2015-04-08 MED ORDER — IBUPROFEN 800 MG PO TABS
800.0000 mg | ORAL_TABLET | Freq: Once | ORAL | Status: AC
  Administered 2015-04-08: 800 mg via ORAL
  Filled 2015-04-08: qty 1

## 2015-04-08 MED ORDER — IBUPROFEN 800 MG PO TABS: 800.0000 mg | ORAL_TABLET | Freq: Three times a day (TID) | ORAL | Status: DC | PRN

## 2015-04-08 NOTE — Discharge Instructions (Signed)
° ° °  Substance Abuse Resources Organization         Address  Phone  Notes  [facility]  [phone]   [facility]  [phone]   [facility] [place]  [phone]   [facility]  [phone]   [facility]  [phone]
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
## 2015-04-08 NOTE — Progress Notes (Signed)
Utilization review completed. Fermina Mishkin, RN, BSN. 

## 2015-04-08 NOTE — Care Management Note (Signed)
Case Management Note  Patient Details  Name: Darren Barajas MRN: [id number] Date of Birth: 11/17/1985  Subjective/Objective:            Lactic acidosis, AKI        Action/Plan:  Home   Expected Discharge Date:  04/08/2015              Expected Discharge Plan:  Home/Self Care  In-House Referral:     Discharge planning Services  CM Consult, [facility], [facility] Program       Status of Service:  Completed, signed off  Medicare Important Message Given:    Date Medicare IM Given:    Medicare IM give by:    Date Additional Medicare IM Given:    Additional Medicare Important Message give by:     If discussed at Long Length of Stay Meetings, dates discussed:    Additional Comments: NCM spoke to pt and provided him with [facility] brochure with appt 04/15/2015 at 3:30 pm. Pt reports working part-time and can afford medications post dc. Elliot Cousin, RN 04/08/2015, 10:59 AM

## 2015-04-15 ENCOUNTER — Inpatient Hospital Stay: Payer: Self-pay | Admitting: Internal Medicine

## 2015-09-04 NOTE — ED Notes (Signed)
Pt arrived via [facility] with [facility] at bedside. Before patient could be transferred to ed stretcher or ED RN's could receive report from EMS, patient stated that he did not want to be here. Pt exited ems stretcher with c collar intact and walked out ems bay doors of ED.

## 2015-09-09 ENCOUNTER — Emergency Department (HOSPITAL_COMMUNITY)
Admission: EM | Admit: 2015-09-09 | Discharge: 2015-09-09 | Disposition: A | Discharge status: Home / Self Care | Payer: Self-pay | Attending: Emergency Medicine | Admitting: Emergency Medicine

## 2015-09-09 ENCOUNTER — Emergency Department (HOSPITAL_COMMUNITY): Payer: Self-pay

## 2015-09-09 ENCOUNTER — Encounter (HOSPITAL_COMMUNITY): Payer: Self-pay | Admitting: *Deleted

## 2015-09-09 DIAGNOSIS — S0081XA Abrasion of other part of head, initial encounter: Secondary | ICD-10-CM | POA: Insufficient documentation

## 2015-09-09 DIAGNOSIS — Y9289 Other specified places as the place of occurrence of the external cause: Secondary | ICD-10-CM | POA: Insufficient documentation

## 2015-09-09 DIAGNOSIS — S0990XA Unspecified injury of head, initial encounter: Secondary | ICD-10-CM

## 2015-09-09 DIAGNOSIS — Z79899 Other long term (current) drug therapy: Secondary | ICD-10-CM | POA: Insufficient documentation

## 2015-09-09 DIAGNOSIS — W228XXA Striking against or struck by other objects, initial encounter: Secondary | ICD-10-CM | POA: Insufficient documentation

## 2015-09-09 DIAGNOSIS — Z8709 Personal history of other diseases of the respiratory system: Secondary | ICD-10-CM | POA: Insufficient documentation

## 2015-09-09 DIAGNOSIS — E0500 Thyrotoxicosis with diffuse goiter without thyrotoxic crisis or storm: Secondary | ICD-10-CM | POA: Insufficient documentation

## 2015-09-09 DIAGNOSIS — F17210 Nicotine dependence, cigarettes, uncomplicated: Secondary | ICD-10-CM | POA: Insufficient documentation

## 2015-09-09 DIAGNOSIS — I10 Essential (primary) hypertension: Secondary | ICD-10-CM | POA: Insufficient documentation

## 2015-09-09 DIAGNOSIS — S060X1A Concussion with loss of consciousness of 30 minutes or less, initial encounter: Secondary | ICD-10-CM | POA: Insufficient documentation

## 2015-09-09 DIAGNOSIS — Y998 Other external cause status: Secondary | ICD-10-CM | POA: Insufficient documentation

## 2015-09-09 DIAGNOSIS — Y9389 Activity, other specified: Secondary | ICD-10-CM | POA: Insufficient documentation

## 2015-09-09 MED ORDER — OXYCODONE-ACETAMINOPHEN 5-325 MG PO TABS
1.0000 | ORAL_TABLET | Freq: Once | ORAL | Status: AC
  Administered 2015-09-09: 1 via ORAL

## 2015-09-09 MED ORDER — LEVOTHYROXINE SODIUM 50 MCG PO TABS: 50.0000 ug | ORAL_TABLET | Freq: Every day | ORAL | Status: DC

## 2015-09-09 MED ORDER — IBUPROFEN 800 MG PO TABS
800.0000 mg | ORAL_TABLET | Freq: Once | ORAL | Status: AC
  Administered 2015-09-09: 800 mg via ORAL
  Filled 2015-09-09: qty 1

## 2015-09-09 MED ORDER — OXYCODONE-ACETAMINOPHEN 5-325 MG PO TABS
ORAL_TABLET | ORAL | Status: DC
Start: 2015-09-09 — End: 2015-09-10
  Filled 2015-09-09: qty 1

## 2015-09-09 NOTE — ED Notes (Signed)
In route to CT pt expressed to this tech that he was having chest pain. This tech called nurse/first, spoke with Anette RiedelNoah, EMT he will pull pt back to triage when pt returns to waiting room.

## 2015-09-09 NOTE — ED Notes (Signed)
Discharge instructions and prescription given- voiced understanding 
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
## 2015-09-09 NOTE — Discharge Instructions (Signed)
Concussion, Adult  A concussion, or closed-head injury, is a brain injury caused by a direct blow to the head or by a quick and sudden movement (jolt) of the head or neck. Concussions are usually not life-threatening. Even so, the effects of a concussion can be serious. If you have had a concussion before, you are more likely to experience concussion-like symptoms after a direct blow to the head.   CAUSES  · Direct blow to the head, such as from running into another player during a soccer game, being hit in a fight, or hitting your head on a hard surface.  · A jolt of the head or neck that causes the brain to move back and forth inside the skull, such as in a car crash.  SIGNS AND SYMPTOMS  The signs of a concussion can be hard to notice. Early on, they may be missed by you, family members, and health care providers. You may look fine but act or feel differently.  Symptoms are usually temporary, but they may last for days, weeks, or even longer. Some symptoms may appear right away while others may not show up for hours or days. Every head injury is different. Symptoms include:  · Mild to moderate headaches that will not go away.  · A feeling of pressure inside your head.  · Having more trouble than usual:    Learning or remembering things you have heard.    Answering questions.    Paying attention or concentrating.    Organizing daily tasks.    Making decisions and solving problems.  · Slowness in thinking, acting or reacting, speaking, or reading.  · Getting lost or being easily confused.  · Feeling tired all the time or lacking energy (fatigued).  · Feeling drowsy.  · Sleep disturbances.    Sleeping more than usual.    Sleeping less than usual.    Trouble falling asleep.    Trouble sleeping (insomnia).  · Loss of balance or feeling lightheaded or dizzy.  · Nausea or vomiting.  · Numbness or tingling.  · Increased sensitivity to:    Sounds.    Lights.    Distractions.  · Vision problems or eyes that tire  easily.  · Diminished sense of taste or smell.  · Ringing in the ears.  · Mood changes such as feeling sad or anxious.  · Becoming easily irritated or angry for little or no reason.  · Lack of motivation.  · Seeing or hearing things other people do not see or hear (hallucinations).  DIAGNOSIS  Your health care provider can usually diagnose a concussion based on a description of your injury and symptoms. He or she will ask whether you passed out (lost consciousness) and whether you are having trouble remembering events that happened right before and during your injury.  Your evaluation might include:  · A brain scan to look for signs of injury to the brain. Even if the test shows no injury, you may still have a concussion.  · Blood tests to be sure other problems are not present.  TREATMENT  · Concussions are usually treated in an emergency department, in urgent care, or at a clinic. You may need to stay in the hospital overnight for further treatment.  · Tell your health care provider if you are taking any medicines, including prescription medicines, over-the-counter medicines, and natural remedies. Some medicines, such as blood thinners (anticoagulants) and aspirin, may increase the chance of complications. Also tell your health care   provider whether you have had alcohol or are taking illegal drugs. This information may affect treatment.  · Your health care provider will send you home with important instructions to follow.  · How fast you will recover from a concussion depends on many factors. These factors include how severe your concussion is, what part of your brain was injured, your age, and how healthy you were before the concussion.  · Most people with mild injuries recover fully. Recovery can take time. In general, recovery is slower in older persons. Also, persons who have had a concussion in the past or have other medical problems may find that it takes longer to recover from their current injury.  HOME  CARE INSTRUCTIONS  General Instructions  · Carefully follow the directions your health care provider gave you.  · Only take over-the-counter or prescription medicines for pain, discomfort, or fever as directed by your health care provider.  · Take only those medicines that your health care provider has approved.  · Do not drink alcohol until your health care provider says you are well enough to do so. Alcohol and certain other drugs may slow your recovery and can put you at risk of further injury.  · If it is harder than usual to remember things, write them down.  · If you are easily distracted, try to do one thing at a time. For example, do not try to watch TV while fixing dinner.  · Talk with family members or close friends when making important decisions.  · Keep all follow-up appointments. Repeated evaluation of your symptoms is recommended for your recovery.  · Watch your symptoms and tell others to do the same. Complications sometimes occur after a concussion. Older adults with a brain injury may have a higher risk of serious complications, such as a blood clot on the brain.  · Tell your teachers, school nurse, school counselor, coach, athletic trainer, or work manager about your injury, symptoms, and restrictions. Tell them about what you can or cannot do. They should watch for:    Increased problems with attention or concentration.    Increased difficulty remembering or learning new information.    Increased time needed to complete tasks or assignments.    Increased irritability or decreased ability to cope with stress.    Increased symptoms.  · Rest. Rest helps the brain to heal. Make sure you:    Get plenty of sleep at night. Avoid staying up late at night.    Keep the same bedtime hours on weekends and weekdays.    Rest during the day. Take daytime naps or rest breaks when you feel tired.  · Limit activities that require a lot of thought or concentration. These include:    Doing homework or job-related  work.    Watching TV.    Working on the computer.  · Avoid any situation where there is potential for another head injury (football, hockey, soccer, basketball, martial arts, downhill snow sports and horseback riding). Your condition will get worse every time you experience a concussion. You should avoid these activities until you are evaluated by the appropriate follow-up health care providers.  Returning To Your Regular Activities  You will need to return to your normal activities slowly, not all at once. You must give your body and brain enough time for recovery.  · Do not return to sports or other athletic activities until your health care provider tells you it is safe to do so.  · Ask   your health care provider when you can drive, ride a bicycle, or operate heavy machinery. Your ability to react may be slower after a brain injury. Never do these activities if you are dizzy.  · Ask your health care provider about when you can return to work or school.  Preventing Another Concussion  It is very important to avoid another brain injury, especially before you have recovered. In rare cases, another injury can lead to permanent brain damage, brain swelling, or death. The risk of this is greatest during the first 7-10 days after a head injury. Avoid injuries by:  · Wearing a seat belt when riding in a car.  · Drinking alcohol only in moderation.  · Wearing a helmet when biking, skiing, skateboarding, skating, or doing similar activities.  · Avoiding activities that could lead to a second concussion, such as contact or recreational sports, until your health care provider says it is okay.  · Taking safety measures in your home.    Remove clutter and tripping hazards from floors and stairways.    Use grab bars in bathrooms and handrails by stairs.    Place non-slip mats on floors and in bathtubs.    Improve lighting in dim areas.  SEEK MEDICAL CARE IF:  · You have increased problems paying attention or  concentrating.  · You have increased difficulty remembering or learning new information.  · You need more time to complete tasks or assignments than before.  · You have increased irritability or decreased ability to cope with stress.  · You have more symptoms than before.  Seek medical care if you have any of the following symptoms for more than 2 weeks after your injury:  · Lasting (chronic) headaches.  · Dizziness or balance problems.  · Nausea.  · Vision problems.  · Increased sensitivity to noise or light.  · Depression or mood swings.  · Anxiety or irritability.  · Memory problems.  · Difficulty concentrating or paying attention.  · Sleep problems.  · Feeling tired all the time.  SEEK IMMEDIATE MEDICAL CARE IF:  · You have severe or worsening headaches. These may be a sign of a blood clot in the brain.  · You have weakness (even if only in one hand, leg, or part of the face).  · You have numbness.  · You have decreased coordination.  · You vomit repeatedly.  · You have increased sleepiness.  · One pupil is larger than the other.  · You have convulsions.  · You have slurred speech.  · You have increased confusion. This may be a sign of a blood clot in the brain.  · You have increased restlessness, agitation, or irritability.  · You are unable to recognize people or places.  · You have neck pain.  · It is difficult to wake you up.  · You have unusual behavior changes.  · You lose consciousness.  MAKE SURE YOU:  · Understand these instructions.  · Will watch your condition.  · Will get help right away if you are not doing well or get worse.     This information is not intended to replace advice given to you by your health care provider. Make sure you discuss any questions you have with your health care provider.     Document Released: 11/18/2003 Document Revised: 09/18/2014 Document Reviewed: 03/20/2013  Elsevier Interactive Patient Education ©2016 [employer]
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
## 2015-10-19 ENCOUNTER — Encounter (HOSPITAL_COMMUNITY): Payer: Self-pay | Admitting: *Deleted

## 2015-10-19 ENCOUNTER — Emergency Department (HOSPITAL_COMMUNITY)
Admission: EM | Admit: 2015-10-19 | Discharge: 2015-10-19 | Disposition: A | Discharge status: Home / Self Care | Payer: Self-pay | Attending: Emergency Medicine | Admitting: Emergency Medicine

## 2015-10-19 DIAGNOSIS — F1510 Other stimulant abuse, uncomplicated: Secondary | ICD-10-CM | POA: Insufficient documentation

## 2015-10-19 DIAGNOSIS — F1910 Other psychoactive substance abuse, uncomplicated: Secondary | ICD-10-CM | POA: Insufficient documentation

## 2015-10-19 DIAGNOSIS — I10 Essential (primary) hypertension: Secondary | ICD-10-CM | POA: Insufficient documentation

## 2015-10-19 DIAGNOSIS — F17210 Nicotine dependence, cigarettes, uncomplicated: Secondary | ICD-10-CM | POA: Insufficient documentation

## 2015-10-19 DIAGNOSIS — X58XXXA Exposure to other specified factors, initial encounter: Secondary | ICD-10-CM | POA: Insufficient documentation

## 2015-10-19 DIAGNOSIS — Z79899 Other long term (current) drug therapy: Secondary | ICD-10-CM | POA: Insufficient documentation

## 2015-10-19 DIAGNOSIS — Y9389 Activity, other specified: Secondary | ICD-10-CM | POA: Insufficient documentation

## 2015-10-19 DIAGNOSIS — F1210 Cannabis abuse, uncomplicated: Secondary | ICD-10-CM | POA: Insufficient documentation

## 2015-10-19 DIAGNOSIS — E0500 Thyrotoxicosis with diffuse goiter without thyrotoxic crisis or storm: Secondary | ICD-10-CM | POA: Insufficient documentation

## 2015-10-19 DIAGNOSIS — F419 Anxiety disorder, unspecified: Secondary | ICD-10-CM | POA: Insufficient documentation

## 2015-10-19 DIAGNOSIS — F1410 Cocaine abuse, uncomplicated: Secondary | ICD-10-CM | POA: Insufficient documentation

## 2015-10-19 DIAGNOSIS — Z8709 Personal history of other diseases of the respiratory system: Secondary | ICD-10-CM | POA: Insufficient documentation

## 2015-10-19 DIAGNOSIS — Y9289 Other specified places as the place of occurrence of the external cause: Secondary | ICD-10-CM | POA: Insufficient documentation

## 2015-10-19 DIAGNOSIS — Y998 Other external cause status: Secondary | ICD-10-CM | POA: Insufficient documentation

## 2015-10-19 DIAGNOSIS — T50991A Poisoning by other drugs, medicaments and biological substances, accidental (unintentional), initial encounter: Secondary | ICD-10-CM | POA: Insufficient documentation

## 2015-10-19 LAB — COMPREHENSIVE METABOLIC PANEL
ALBUMIN: 4.4 g/dL (ref 3.5–5.0)
ALK PHOS: 41 U/L (ref 38–126)
ALT: 34 U/L (ref 17–63)
AST: 44 U/L — AB (ref 15–41)
Anion gap: 21 — ABNORMAL HIGH (ref 5–15)
BILIRUBIN TOTAL: 1.9 mg/dL — AB (ref 0.3–1.2)
BUN: 5 mg/dL — AB (ref 6–20)
CALCIUM: 9 mg/dL (ref 8.9–10.3)
CO2: 19 mmol/L — ABNORMAL LOW (ref 22–32)
CREATININE: 1.03 mg/dL (ref 0.61–1.24)
Chloride: 98 mmol/L — ABNORMAL LOW (ref 101–111)
GFR calc Af Amer: >60 mL/min (ref >60)
GFR calc non Af Amer: >60 mL/min (ref >60)
GLUCOSE: 135 mg/dL — AB (ref 65–99)
Potassium: 2.7 mmol/L — CL (ref 3.5–5.1)
Sodium: 138 mmol/L (ref 135–145)
TOTAL PROTEIN: 7.6 g/dL (ref 6.5–8.1)

## 2015-10-19 LAB — CBC WITH DIFFERENTIAL/PLATELET
BASOS PCT: 0 %
Basophils Absolute: 0 10*3/uL (ref 0.0–0.1)
EOS ABS: 0 10*3/uL (ref 0.0–0.7)
EOS PCT: 0 %
HEMATOCRIT: 40 % (ref 39.0–52.0)
Hemoglobin: 14 g/dL (ref 13.0–17.0)
Lymphocytes Relative: 29 %
Lymphs Abs: 1.3 10*3/uL (ref 0.7–4.0)
MCH: 31.4 pg (ref 26.0–34.0)
MCHC: 35 g/dL (ref 30.0–36.0)
MCV: 89.7 fL (ref 78.0–100.0)
MONO ABS: 0.8 10*3/uL (ref 0.1–1.0)
MONOS PCT: 19 %
NEUTROS ABS: 2.3 10*3/uL (ref 1.7–7.7)
Neutrophils Relative %: 52 %
Platelets: 240 10*3/uL (ref 150–400)
RBC: 4.46 MIL/uL (ref 4.22–5.81)
RDW: 12.8 % (ref 11.5–15.5)
WBC: 4.3 10*3/uL (ref 4.0–10.5)

## 2015-10-19 LAB — POC OCCULT BLOOD, ED: FECAL OCCULT BLD: POSITIVE — AB

## 2015-10-19 LAB — RAPID URINE DRUG SCREEN, HOSP PERFORMED
Amphetamines: POSITIVE — AB
BARBITURATES: NOT DETECTED
Benzodiazepines: POSITIVE — AB
COCAINE: POSITIVE — AB
Opiates: NOT DETECTED
Tetrahydrocannabinol: POSITIVE — AB

## 2015-10-19 LAB — OCCULT BLOOD GASTRIC / DUODENUM (SPECIMEN CUP): Occult Blood, Gastric: POSITIVE — AB

## 2015-10-19 LAB — ETHANOL: Alcohol, Ethyl (B): 97 mg/dL — ABNORMAL HIGH (ref <5)

## 2015-10-19 MED ORDER — LORAZEPAM 2 MG/ML IJ SOLN
2.0000 mg | Freq: Once | INTRAMUSCULAR | Status: AC
  Administered 2015-10-19: 2 mg via INTRAVENOUS
  Filled 2015-10-19: qty 1

## 2015-10-19 MED ORDER — IBUPROFEN 600 MG PO TABS: 600.0000 mg | ORAL_TABLET | Freq: Three times a day (TID) | ORAL | Status: DC | PRN

## 2015-10-19 MED ORDER — PROMETHAZINE HCL 12.5 MG PO TABS: 12.5000 mg | ORAL_TABLET | Freq: Three times a day (TID) | ORAL | Status: DC | PRN

## 2015-10-19 MED ORDER — PROMETHAZINE HCL 25 MG/ML IJ SOLN
12.5000 mg | Freq: Once | INTRAMUSCULAR | Status: AC
  Administered 2015-10-19: 12.5 mg via INTRAVENOUS
  Filled 2015-10-19: qty 1

## 2015-10-19 MED ORDER — POTASSIUM CHLORIDE 10 MEQ/100ML IV SOLN
10.0000 meq | Freq: Once | INTRAVENOUS | Status: AC
  Administered 2015-10-19: 10 meq via INTRAVENOUS
  Filled 2015-10-19: qty 100

## 2015-10-19 MED ORDER — SODIUM CHLORIDE 0.9 % IV BOLUS (SEPSIS)
1000.0000 mL | Freq: Once | INTRAVENOUS | Status: AC
  Administered 2015-10-19: 1000 mL via INTRAVENOUS

## 2015-10-19 MED ORDER — POTASSIUM CHLORIDE CRYS ER 20 MEQ PO TBCR
40.0000 meq | EXTENDED_RELEASE_TABLET | Freq: Once | ORAL | Status: AC
  Administered 2015-10-19: 40 meq via ORAL
  Filled 2015-10-19: qty 2

## 2015-10-19 MED ORDER — LORAZEPAM 2 MG/ML IJ SOLN: 1.0000 mg | Freq: Once | INTRAMUSCULAR | Status: DC

## 2015-10-19 MED ORDER — SODIUM CHLORIDE 0.9 % IV BOLUS (SEPSIS)
1000.0000 mL | Freq: Once | INTRAVENOUS | Status: AC
Start: 2015-10-19 — End: 2015-10-19
  Administered 2015-10-19: 1000 mL via INTRAVENOUS

## 2015-10-19 NOTE — ED Notes (Signed)
Tyler-PA made aware occult blood is positive.

## 2015-10-19 NOTE — ED Notes (Signed)
Patient verbalized understanding of discharge instructions and denies any further needs or questions at this time. VS stable. Patient ambulatory with steady gait. Assisted to ED entrance in wheelchair.   

## 2015-10-19 NOTE — ED Notes (Signed)
Poison control recommends watching pt for a minimum of 6 hours or until pt returns to baseline. Plenty of fluids, and benzos. Almira Coaster, RN from poison control. Haldol and Geodon aren't recommended. Bicarb bolus to shorten QRS. Watch for seizures.

## 2015-10-19 NOTE — ED Notes (Signed)
Poison control called for reassessment. No further recommendations at this time.

## 2015-10-19 NOTE — ED Notes (Addendum)
Darren Barajas (mom) [phone]

## 2015-10-19 NOTE — ED Notes (Signed)
Pt states he took five ecstacy today by accident. Pt is now very anxious and was diaphoretic on EMS arrival. Pt has no other sx at this time.

## 2015-10-19 NOTE — ED Notes (Signed)
Pt experienced projectile vomiting that is red in color. EVS called to clean room rt emesis on the entirity of the floor.
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
## 2015-10-19 NOTE — ED Provider Notes (Signed)
CSN: [id number]     Arrival date & time 10/19/15  1259 History   First MD Initiated Contact with Patient 10/19/15 1338     Chief Complaint  Patient presents with  . Drug Overdose  . Anxiety   (Consider location/radiation/quality/duration/timing/severity/associated sxs/prior Treatment) HPI 30 y.o. male presents to the Emergency Department today after ingestion of suspected ecstasy. Pt notes that he thought he acquired narcotics off of the street from a dealer for headaches he has been having. He ingested five of the suspected  Narcotics. 1-2 hours later he began to feel anxious and became diaphoretic with palpitations. He called his dealer immediately and was told that it was actually ecstasy and not narcotics. Pt notified EMS and was brought here. Currently no CP/SOB/ABD pain. No N/V/D. No headaches. No vision changes.  Pt had additional complaint of dark red blood noticed in stool for the past 4 days. Not tarry black. No hx hemorrhoids.   Poison Control recommended watching patient for a minimum of 6 hours or until patient returns to baseline. Plenty of fluids, and benzos. Haldol and Geodon are not recommended. Bicarb bolus to shorten QRS if prolonged.       Past Medical History  Diagnosis Date  . Hypertension     Not currently taking medications  . Graves' disease     treated w/radioactive ioding ablation in 2001  . Anginal pain (HCC)   . Chronic bronchitis (HCC)     "get it q couple months; q couple weeks" (04/07/2015)  . Headache     "weekly" (04/07/2015)   Past Surgical History  Procedure Laterality Date  . No past surgeries     Family History  Problem Relation Age of Onset  . Hyperthyroidism Mother   . Hyperthyroidism Maternal Uncle    Social History  Substance Use Topics  . Smoking status: Current Every Day Smoker -- 1.00 packs/day for 10 years    Types: Cigarettes  . Smokeless tobacco: None  . Alcohol Use: 21.6 oz/week    0 Standard drinks or equivalent, 36 Cans of beer  per week     Comment: 04/07/2015 "12 pack beer 2-3 days/wk"    Review of Systems ROS reviewed and all are negative for acute change except as noted in the HPI.  Allergies  Review of patient's allergies indicates no known allergies.  Home Medications   Prior to Admission medications   Medication Sig Start Date End Date Taking? Authorizing Provider  ibuprofen (ADVIL,MOTRIN) 800 MG tablet Take 1 tablet (800 mg total) by mouth every 8 (eight) hours as needed. Patient taking differently: Take 800 mg by mouth every 8 (eight) hours as needed for headache.  04/08/15   Gust Rung, DO  levothyroxine (SYNTHROID, LEVOTHROID) 50 MCG tablet Take 1 tablet (50 mcg total) by mouth daily before breakfast. 09/09/15   Margarita Grizzle, MD   BP 136/85 mmHg  Pulse 100  Temp(Src) 98.7 F (37.1 C) (Oral)  Resp 17  SpO2 98%   Physical Exam  Constitutional: He is oriented to person, place, and time. He appears well-developed and well-nourished.  HENT:  Head: Normocephalic and atraumatic.  Eyes: EOM are normal. Pupils are equal, round, and reactive to light.  Neck: Normal range of motion. Neck supple.  Cardiovascular: Normal rate, regular rhythm and normal heart sounds.   Pulmonary/Chest: Effort normal and breath sounds normal. He exhibits no tenderness.  Abdominal: Soft. There is no tenderness.  Genitourinary: Rectal exam shows no external hemorrhoid, no internal hemorrhoid, no mass,  no tenderness and anal tone normal. Guaiac positive stool.  Chaperone was present during Rectal exam. Pain with DRE. Possible Anal Fissure  Musculoskeletal: Normal range of motion.  Neurological: He is alert and oriented to person, place, and time.  Skin: Skin is warm and dry.  Psychiatric: He has a normal mood and affect. His behavior is normal. Thought content normal. He expresses no homicidal and no suicidal ideation. He expresses no suicidal plans and no homicidal plans.  Nursing note and vitals reviewed.  ED Course   Procedures (including critical care time) Labs Review Labs Reviewed  COMPREHENSIVE METABOLIC PANEL - Abnormal; Notable for the following:    Potassium 2.7 (*)    Chloride 98 (*)    CO2 19 (*)    Glucose, Bld 135 (*)    BUN 5 (*)    AST 44 (*)    Total Bilirubin 1.9 (*)    Anion gap 21 (*)    All other components within normal limits  ETHANOL - Abnormal; Notable for the following:    Alcohol, Ethyl (B) 97 (*)    All other components within normal limits  URINE RAPID DRUG SCREEN, HOSP PERFORMED - Abnormal; Notable for the following:    Cocaine POSITIVE (*)    Benzodiazepines POSITIVE (*)    Amphetamines POSITIVE (*)    Tetrahydrocannabinol POSITIVE (*)    All other components within normal limits  OCCULT BLOOD GASTRIC / DUODENUM (SPECIMEN CUP) - Abnormal; Notable for the following:    Occult Blood, Gastric POSITIVE (*)    All other components within normal limits  POC OCCULT BLOOD, ED - Abnormal; Notable for the following:    Fecal Occult Bld POSITIVE (*)    All other components within normal limits  CBC WITH DIFFERENTIAL/PLATELET   Imaging Review No results found. I have personally reviewed and evaluated these images and lab results as part of my medical decision-making.   EKG Interpretation   Date/Time:  Tuesday October 19 2015 13:10:28 EST Ventricular Rate:  105 PR Interval:  153 QRS Duration: 116 QT Interval:  338 QTC Calculation: 447 R Axis:   61 Text Interpretation:  Sinus tachycardia Nonspecific intraventricular  conduction delay Since previous tracing rate faster Confirmed by Karma Ganja   MD, MARTHA [phone]) on 10/19/2015 2:24:07 PM     MDM  I have reviewed relevant laboratory values.I have reviewed relevant imaging studies.I personally interpreted the relevant EKG.I have reviewed the relevant previous healthcare records.I have reviewed EMS Documentation.I obtained HPI from historian. Patient discussed with supervising physician  ED Course:  Assessment: 65y M  with possible ingestion of ecstasy presents acute agitation, diaphoresis, tachycardia. Treated initially with Ativan, which calmed him down. Contact with Poison Control suggested watching patient for a minimum of 6 hours or until patient returns to baseline. Plenty of fluids, and benzos. Haldol and Geodon are not recommended. Bicarb bolus to shorten QRS if prolonged. On exam, patient exhibiting no symptoms of CP/SOB/ABD pain. VSS. Not tachycardic. Had one episode of emesis with red color and positive hemoccult. Possible mallory weiss tear due to forceful emesis, but since resolved. Performed a Guaiac due to blood noted in stool, which was positive. Most likely anal fissure based on HPI as well as there was pain with palpation in internal rectum. Reports pain with bowel movements. Hgb stable. Will recommend outpatient treatment will close follow up to PCP. Patient is in no acute distress. Vital Signs are stable. Patient is able to ambulate. Patient able to tolerate PO.  Disposition/Plan:  DC Home Additional Verbal discharge instructions given and discussed with patient.  Pt Instructed to f/u with PCP in the next 48 hours for evaluation and treatment of symptoms. Return precautions given Pt acknowledges and agrees with plan  Supervising Physician Jerelyn Scott, MD   Final diagnoses:  Drug overdose, accidental or unintentional, initial encounter     Audry Pili, PA-C 10/19/15 1910  Jerelyn Scott, MD 10/22/15 [phone]

## 2015-10-19 NOTE — ED Notes (Signed)
Dr. Effie Shy informed of pt lab results.

## 2015-10-19 NOTE — ED Notes (Signed)
Pt jumped out of the bed and started screaming, "I'm gonna die". Pt put back into bed and hooked back up. Poison control called.

## 2016-02-25 ENCOUNTER — Encounter (HOSPITAL_COMMUNITY): Payer: Self-pay | Admitting: *Deleted

## 2016-02-25 ENCOUNTER — Emergency Department (HOSPITAL_COMMUNITY): Payer: Self-pay

## 2016-02-25 ENCOUNTER — Emergency Department (HOSPITAL_COMMUNITY)
Admission: EM | Admit: 2016-02-25 | Discharge: 2016-02-26 | Disposition: A | Discharge status: Home / Self Care | Payer: Self-pay | Attending: Emergency Medicine | Admitting: Emergency Medicine

## 2016-02-25 DIAGNOSIS — Z79899 Other long term (current) drug therapy: Secondary | ICD-10-CM | POA: Insufficient documentation

## 2016-02-25 DIAGNOSIS — Y939 Activity, unspecified: Secondary | ICD-10-CM | POA: Insufficient documentation

## 2016-02-25 DIAGNOSIS — Y999 Unspecified external cause status: Secondary | ICD-10-CM | POA: Insufficient documentation

## 2016-02-25 DIAGNOSIS — Y929 Unspecified place or not applicable: Secondary | ICD-10-CM | POA: Insufficient documentation

## 2016-02-25 DIAGNOSIS — F17210 Nicotine dependence, cigarettes, uncomplicated: Secondary | ICD-10-CM | POA: Insufficient documentation

## 2016-02-25 DIAGNOSIS — R51 Headache: Secondary | ICD-10-CM | POA: Insufficient documentation

## 2016-02-25 DIAGNOSIS — W228XXA Striking against or struck by other objects, initial encounter: Secondary | ICD-10-CM | POA: Insufficient documentation

## 2016-02-25 DIAGNOSIS — I10 Essential (primary) hypertension: Secondary | ICD-10-CM | POA: Insufficient documentation

## 2016-02-25 DIAGNOSIS — S022XXA Fracture of nasal bones, initial encounter for closed fracture: Secondary | ICD-10-CM | POA: Insufficient documentation

## 2016-02-25 DIAGNOSIS — Z791 Long term (current) use of non-steroidal anti-inflammatories (NSAID): Secondary | ICD-10-CM | POA: Insufficient documentation

## 2016-02-25 MED ORDER — ONDANSETRON 4 MG PO TBDP
8.0000 mg | ORAL_TABLET | Freq: Once | ORAL | Status: DC
  Filled 2016-02-25: qty 2

## 2016-02-25 MED ORDER — OXYCODONE-ACETAMINOPHEN 5-325 MG PO TABS
1.0000 | ORAL_TABLET | ORAL | Status: DC | PRN
  Administered 2016-02-25: 1 via ORAL
  Filled 2016-02-25: qty 1

## 2016-02-25 NOTE — ED Notes (Signed)
Pt in c/o injury to his nose, will not give details about how injury occurred, nose is bleeding currently, injury occurred a few hours ago, deformity to nose noted
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
## 2016-02-26 ENCOUNTER — Emergency Department (HOSPITAL_COMMUNITY): Payer: Self-pay

## 2016-02-26 ENCOUNTER — Encounter (HOSPITAL_COMMUNITY): Payer: Self-pay | Admitting: *Deleted

## 2016-02-26 ENCOUNTER — Emergency Department (HOSPITAL_COMMUNITY)
Admission: EM | Admit: 2016-02-26 | Discharge: 2016-02-26 | Disposition: A | Discharge status: Home / Self Care | Payer: Self-pay | Attending: Emergency Medicine | Admitting: Emergency Medicine

## 2016-02-26 DIAGNOSIS — W228XXA Striking against or struck by other objects, initial encounter: Secondary | ICD-10-CM | POA: Insufficient documentation

## 2016-02-26 DIAGNOSIS — Z79899 Other long term (current) drug therapy: Secondary | ICD-10-CM | POA: Insufficient documentation

## 2016-02-26 DIAGNOSIS — Z91038 Other insect allergy status: Secondary | ICD-10-CM

## 2016-02-26 DIAGNOSIS — S022XXD Fracture of nasal bones, subsequent encounter for fracture with routine healing: Secondary | ICD-10-CM | POA: Insufficient documentation

## 2016-02-26 DIAGNOSIS — F17210 Nicotine dependence, cigarettes, uncomplicated: Secondary | ICD-10-CM | POA: Insufficient documentation

## 2016-02-26 DIAGNOSIS — T7849XA Other allergy, initial encounter: Secondary | ICD-10-CM | POA: Insufficient documentation

## 2016-02-26 DIAGNOSIS — I10 Essential (primary) hypertension: Secondary | ICD-10-CM | POA: Insufficient documentation

## 2016-02-26 MED ORDER — IBUPROFEN 800 MG PO TABS: 800.0000 mg | ORAL_TABLET | Freq: Three times a day (TID) | ORAL | Status: DC

## 2016-02-26 MED ORDER — HYDROCODONE-ACETAMINOPHEN 5-325 MG PO TABS: 1.0000 | ORAL_TABLET | Freq: Four times a day (QID) | ORAL | Status: DC | PRN

## 2016-02-26 MED ORDER — CEPHALEXIN 500 MG PO CAPS
500.0000 mg | ORAL_CAPSULE | Freq: Four times a day (QID) | ORAL | Status: DC
Start: 2016-02-26 — End: 2017-06-28

## 2016-02-26 MED ORDER — DIPHENHYDRAMINE HCL 25 MG PO TABS: 50.0000 mg | ORAL_TABLET | ORAL | Status: DC | PRN

## 2016-02-26 MED ORDER — DIPHENHYDRAMINE HCL 25 MG PO CAPS
25.0000 mg | ORAL_CAPSULE | Freq: Once | ORAL | Status: AC
  Administered 2016-02-26: 25 mg via ORAL
  Filled 2016-02-26: qty 1

## 2016-02-26 NOTE — Discharge Instructions (Signed)
Insect Bite Mosquitoes, flies, fleas, bedbugs, and many other insects can bite. Insect bites are different from insect stings. A sting is when poison (venom) is injected into the skin. Insect bites can cause pain or itching for a few days, but they are usually not serious. Some insects can spread diseases to people through a bite. SYMPTOMS  Symptoms of an insect bite include:  Itching or pain in the bite area.  Redness and swelling in the bite area.  An open wound (skin ulcer). In many cases, symptoms last for 2-4 days.  DIAGNOSIS  This condition is usually diagnosed based on symptoms and a physical exam. TREATMENT  Treatment is usually not needed for an insect bite. Symptoms often go away on their own. Your health care provider may recommend creams or lotions to help reduce itching. Antibiotic medicines may be prescribed if the bite becomes infected. A tetanus shot may be given in some cases. If you develop an allergic reaction to an insect bite, your health care provider will prescribe medicines to treat the reaction (antihistamines). This is rare. HOME CARE INSTRUCTIONS  Do not scratch the bite area.  Keep the bite area clean and dry. Wash the bite area daily with soap and water as told by your health care provider.  If directed, applyice to the bite area.  Put ice in a plastic bag.  Place a towel between your skin and the bag.  Leave the ice on for 20 minutes, 2-3 times per day.  To help reduce itching and swelling, try applying a baking soda paste, cortisone cream, or calamine lotion to the bite area as told by your health care provider.  Apply or take over-the-counter and prescription medicines only as told by your health care provider.  If you were prescribed an antibiotic medicine, use it as told by your health care provider. Do not stop using the antibiotic even if your condition improves.  Keep all follow-up visits as told by your health care provider. This is  important. PREVENTION   Use insect repellent. The best insect repellents contain:  DEET, picaridin, oil of lemon eucalyptus (OLE), or IR3535.  Higher amounts of an active ingredient.  When you are outdoors, wear clothing that covers your arms and legs.  Avoid opening windows that do not have window screens. SEEK MEDICAL CARE IF:  You have increased redness, swelling, or pain in the bite area.  You have a fever. SEEK IMMEDIATE MEDICAL CARE IF:   You have joint pain.   You have fluid, blood, or pus coming from the bite area.  You have a headache or neck pain.  You have unusual weakness.  You have a rash.  You have chest pain or shortness of breath.  You have abdominal pain, nausea, or vomiting.  You feel unusually tired or sleepy.   This information is not intended to replace advice given to you by your health care provider. Make sure you discuss any questions you have with your health care provider.   Document Released: 10/05/2004 Document Revised: 05/19/2015 Document Reviewed: 01/13/2015 Elsevier Interactive Patient Education 2016 [employer] Inc[employer]  

## 2016-02-26 NOTE — ED Notes (Signed)
Pt escorted to shower by this RN and Engineer, miningBrian RN.

## 2016-02-26 NOTE — ED Notes (Signed)
Patient waiting on girlfriend to bring clean clothes to wear home.  Will discharge at that time.  Room being held for cleaning

## 2016-02-26 NOTE — ED Notes (Signed)
Pt escorted back to room by this RN. Pt has showered, in new gown. Contact precautions poster on door.

## 2016-02-26 NOTE — ED Notes (Signed)
Patient Alert and oriented X4. Stable and ambulatory. Patient verbalized understanding of the discharge instructions.  Patient belongings were taken by the patient.  
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
## 2016-02-26 NOTE — Discharge Instructions (Signed)
Ibuprofen for mild to moderate pain. Norco for severe pain - This can make you very drowsy - please do not drink alcohol, operate heavy machinery or drive on this medication.  Apply ice for additional pain relief.  Follow up with the physician listed in 1 week for re-evaluation.  Return to ER for new or worsening symptoms, any additional concerns.

## 2016-02-26 NOTE — ED Notes (Signed)
Pt escorted from shower to CT scan by this RN

## 2016-07-25 ENCOUNTER — Encounter (HOSPITAL_COMMUNITY): Payer: Self-pay | Admitting: Emergency Medicine

## 2016-07-25 ENCOUNTER — Emergency Department (HOSPITAL_COMMUNITY)
Admission: EM | Admit: 2016-07-25 | Discharge: 2016-07-25 | Disposition: A | Discharge status: Home / Self Care | Payer: Self-pay | Attending: Emergency Medicine | Admitting: Emergency Medicine

## 2016-07-25 DIAGNOSIS — R51 Headache: Secondary | ICD-10-CM | POA: Insufficient documentation

## 2016-07-25 DIAGNOSIS — R519 Headache, unspecified: Secondary | ICD-10-CM

## 2016-07-25 DIAGNOSIS — E039 Hypothyroidism, unspecified: Secondary | ICD-10-CM | POA: Insufficient documentation

## 2016-07-25 DIAGNOSIS — R112 Nausea with vomiting, unspecified: Secondary | ICD-10-CM | POA: Insufficient documentation

## 2016-07-25 DIAGNOSIS — Z79899 Other long term (current) drug therapy: Secondary | ICD-10-CM | POA: Insufficient documentation

## 2016-07-25 DIAGNOSIS — F17210 Nicotine dependence, cigarettes, uncomplicated: Secondary | ICD-10-CM | POA: Insufficient documentation

## 2016-07-25 DIAGNOSIS — R197 Diarrhea, unspecified: Secondary | ICD-10-CM | POA: Insufficient documentation

## 2016-07-25 DIAGNOSIS — I10 Essential (primary) hypertension: Secondary | ICD-10-CM | POA: Insufficient documentation

## 2016-07-25 LAB — COMPREHENSIVE METABOLIC PANEL
ALBUMIN: 4.4 g/dL (ref 3.5–5.0)
ALK PHOS: 30 U/L — AB (ref 38–126)
ALT: 27 U/L (ref 17–63)
ANION GAP: 8 (ref 5–15)
AST: 27 U/L (ref 15–41)
BILIRUBIN TOTAL: 1 mg/dL (ref 0.3–1.2)
BUN: 9 mg/dL (ref 6–20)
CALCIUM: 9.3 mg/dL (ref 8.9–10.3)
CO2: 26 mmol/L (ref 22–32)
CREATININE: 0.83 mg/dL (ref 0.61–1.24)
Chloride: 103 mmol/L (ref 101–111)
GFR calc non Af Amer: >60 mL/min (ref >60)
GLUCOSE: 113 mg/dL — AB (ref 65–99)
Potassium: 3.7 mmol/L (ref 3.5–5.1)
Sodium: 137 mmol/L (ref 135–145)
TOTAL PROTEIN: 7.7 g/dL (ref 6.5–8.1)

## 2016-07-25 LAB — CBC WITH DIFFERENTIAL/PLATELET
Basophils Absolute: 0 10*3/uL (ref 0.0–0.1)
Basophils Relative: 0 %
Eosinophils Absolute: 0.1 10*3/uL (ref 0.0–0.7)
Eosinophils Relative: 2 %
HEMATOCRIT: 39.7 % (ref 39.0–52.0)
HEMOGLOBIN: 13.7 g/dL (ref 13.0–17.0)
LYMPHS ABS: 1.4 10*3/uL (ref 0.7–4.0)
Lymphocytes Relative: 38 %
MCH: 31.3 pg (ref 26.0–34.0)
MCHC: 34.5 g/dL (ref 30.0–36.0)
MCV: 90.6 fL (ref 78.0–100.0)
MONOS PCT: 14 %
Monocytes Absolute: 0.5 10*3/uL (ref 0.1–1.0)
NEUTROS ABS: 1.8 10*3/uL (ref 1.7–7.7)
NEUTROS PCT: 46 %
Platelets: 221 10*3/uL (ref 150–400)
RBC: 4.38 MIL/uL (ref 4.22–5.81)
RDW: 13.6 % (ref 11.5–15.5)
WBC: 3.8 10*3/uL — ABNORMAL LOW (ref 4.0–10.5)

## 2016-07-25 LAB — TSH: TSH: 3.094 u[IU]/mL (ref 0.350–4.500)

## 2016-07-25 LAB — LIPASE, BLOOD: Lipase: 20 U/L (ref 11–51)

## 2016-07-25 LAB — I-STAT CG4 LACTIC ACID, ED: Lactic Acid, Venous: 1.35 mmol/L (ref 0.5–1.9)

## 2016-07-25 MED ORDER — DIPHENHYDRAMINE HCL 50 MG/ML IJ SOLN
25.0000 mg | Freq: Once | INTRAMUSCULAR | Status: AC
  Administered 2016-07-25: 25 mg via INTRAVENOUS
  Filled 2016-07-25: qty 1

## 2016-07-25 MED ORDER — DEXAMETHASONE SODIUM PHOSPHATE 10 MG/ML IJ SOLN
10.0000 mg | Freq: Once | INTRAMUSCULAR | Status: AC
  Administered 2016-07-25: 10 mg via INTRAVENOUS
  Filled 2016-07-25: qty 1

## 2016-07-25 MED ORDER — SODIUM CHLORIDE 0.9 % IV BOLUS (SEPSIS)
1000.0000 mL | Freq: Once | INTRAVENOUS | Status: AC
  Administered 2016-07-25: 1000 mL via INTRAVENOUS

## 2016-07-25 MED ORDER — ONDANSETRON HCL 4 MG PO TABS: 4.0000 mg | ORAL_TABLET | Freq: Four times a day (QID) | ORAL | 0 refills | Status: DC

## 2016-07-25 MED ORDER — LEVOTHYROXINE SODIUM 50 MCG PO TABS: 50.0000 ug | ORAL_TABLET | Freq: Every day | ORAL | 0 refills | Status: DC

## 2016-07-25 MED ORDER — PROCHLORPERAZINE EDISYLATE 5 MG/ML IJ SOLN
10.0000 mg | Freq: Once | INTRAMUSCULAR | Status: AC
Start: 2016-07-25 — End: 2016-07-25
  Administered 2016-07-25: 10 mg via INTRAVENOUS
  Filled 2016-07-25: qty 2

## 2016-07-25 NOTE — ED Triage Notes (Signed)
Pt c/o diarrhea, headache, onset 2 days ago emesis onset this morning. Scant bright red blood with stooling, pt reports straining. No abdominal pain.

## 2016-07-25 NOTE — Progress Notes (Signed)
CM spoke with [place] who [place].  CM discussed and provided written information to assist pt with determining choice for uninsured accepting pcps, discussed the importance of pcp vs EDP services for f/u care, www.needymeds.org, www.goodrx.com, discounted pharmacies and other [place] resources such as [facility] , [facility], affordable care act, financial assistance, uninsured dental services, [place] med assist, DSS and  health department  Reviewed resources for [place] uninsured accepting pcps like [facility], [facility] medicine [facility] [address], [facility], [facility], local urgent care centers, [facility], [facility], general medical clinics, [facility] of [facility] [place], [facility] urgent care plus others, medication resources, [facility] out patient [facility]ies and housing Pt voiced understanding and appreciation of resources provided   Provided [facility] contact information Pt [facility] by [facility]

## 2016-07-25 NOTE — ED Provider Notes (Signed)
[facility]-EMERGENCY DEPT Provider Note   CSN: [id number] Arrival date & time: 07/25/16  0845     History   Chief Complaint No chief complaint on file.   HPI Darren Barajas is a 30 y.o. male with a past medical history significant for Graves' disease, headaches, hypertension, and prior kidney injury who presents with diarrhea, nausea, vomiting, chills, and headache. Patient reports that he has been feeling bad for the last 3 days. He says that his girlfriend's daughter had a viral infection recently. He says that he has had multiple episodes of diarrhea and has had nausea with several pills of vomiting. He denies any blood in the vomit. He does report some scant bright blood in his bowel movement which she associates with straining. He says that he has had rectal bleeding in the past but does not want to be evaluated for that at this time. He denies any dark stools or dark blood in the bm. He describes his headache as sharp, across his head, and waxing and waning. He reports photophobia and phonophobia. He says that he normally takes pain medicine to help his headaches. He denies any vision changes, coordination problems, numbness, tingling, or weakness of any extremities. He denies any recent head trauma. He denies any neck pain or neck stiffness. Says he has not drank in quite as much fluid over the last few days.  Of note, patient reports that he has been taking his mother's thyroid medicines because he is out. He is interested in getting a refill of this medication.    The history is provided by the patient, a significant other and medical records. No language interpreter was used.  Emesis   This is a new problem. The current episode started more than 2 days ago. The problem occurs 2 to 4 times per day. The problem has been gradually improving. The emesis has an appearance of stomach contents. There has been no fever. Associated symptoms include chills, diarrhea and headaches. Pertinent  negatives include no abdominal pain, no cough, no fever and no URI. Risk factors include ill contacts.  Diarrhea   This is a new problem. The problem has been gradually improving. Associated symptoms include vomiting, chills and headaches. Pertinent negatives include no abdominal pain, no URI and no cough.    Past Medical History:  Diagnosis Date  . Anginal pain (HCC)   . Chronic bronchitis (HCC)    "get it q couple months; q couple weeks" (04/07/2015)  . Graves' disease    treated w/radioactive ioding ablation in 2001  . Headache    "weekly" (04/07/2015)  . Hypertension    Not currently taking medications    Patient Active Problem List   Diagnosis Date Noted  . High anion gap metabolic acidosis 04/07/2015  . 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 Lactic acidosis 04/07/2015  . Hypothyroidism due to acquired atrophy of thyroid 04/07/2015  . AKI (acute kidney injury) (HCC) 04/07/2015  . History of hypertension 04/07/2015  . Contusion of right knee 04/07/2015  . Lactic acid acidosis 04/07/2015  . Acute renal failure syndrome (HCC)   . Substance abuse   . Tachycardia     Past Surgical History:  Procedure Laterality Date  . NO PAST SURGERIES         Home Medications    Prior to Admission medications   Medication Sig Start Date End Date Taking? Authorizing Provider  cephALEXin (KEFLEX) 500 MG capsule Take 1 capsule (500 mg total) by mouth 4 (four) times daily. 02/26/16  [facility] Ward, PA-C  diphenhydrAMINE (BENADRYL) 25 MG tablet Take 2 tablets (50 mg total) by mouth every 4 (four) hours as needed for itching. 02/26/16   Arthor Captain, PA-C  HYDROcodone-acetaminophen (NORCO/VICODIN) 5-325 MG tablet Take 1 tablet by mouth every 6 (six) hours as needed for severe pain. 02/26/16   Jaime Pilcher Ward, PA-C  ibuprofen (ADVIL,MOTRIN) 800 MG tablet Take 1 tablet (800 mg total) by mouth 3 (three) times daily. 02/26/16   Chase Picket Ward, PA-C  levothyroxine (SYNTHROID, LEVOTHROID) 50 MCG  tablet Take 1 tablet (50 mcg total) by mouth daily before breakfast. 09/09/15   Margarita Grizzle, MD  promethazine (PHENERGAN) 12.5 MG tablet Take 1 tablet (12.5 mg total) by mouth every 8 (eight) hours as needed for nausea or vomiting. Patient not taking: Reported on 02/25/2016 10/19/15   Audry Pili, PA-C    Family History Family History  Problem Relation Age of Onset  . Hyperthyroidism Mother   . Hyperthyroidism Maternal Uncle     Social History Social History  Substance Use Topics  . Smoking status: Current Every Day Smoker    Packs/day: 1.00    Years: 10.00    Types: Cigarettes  . Smokeless tobacco: Not on file  . Alcohol use 21.6 oz/week    36 Cans of beer per week     Comment: 04/07/2015 "12 pack beer 2-3 days/wk"     Allergies   Patient has no known allergies.   Review of Systems Review of Systems  Constitutional: Positive for chills. Negative for activity change, diaphoresis, fatigue and fever.  HENT: Negative for congestion and rhinorrhea.   Eyes: Negative for visual disturbance.  Respiratory: Negative for cough, chest tightness, shortness of breath, wheezing and stridor.   Cardiovascular: Negative for chest pain, palpitations and leg swelling.  Gastrointestinal: Positive for diarrhea, nausea and vomiting. Negative for abdominal distention, abdominal pain, blood in stool and constipation.  Genitourinary: Negative for difficulty urinating, dysuria and flank pain.  Musculoskeletal: Negative for back pain and gait problem.  Skin: Negative for rash and wound.  Neurological: Positive for headaches. Negative for dizziness, weakness and light-headedness.  Psychiatric/Behavioral: Negative for agitation.  All other systems reviewed and are negative.    Physical Exam Updated Vital Signs BP (!) 117/105 (BP Location: Left Arm)   Pulse 72   Temp 98.4 F (36.9 C) (Oral)   Resp 16   SpO2 98%   Physical Exam  Constitutional: He is oriented to person, place, and time. He  appears well-developed and well-nourished.  HENT:  Head: Normocephalic and atraumatic.  Right Ear: External ear normal.  Left Ear: External ear normal.  Nose: Nose normal.  Mouth/Throat: Oropharynx is clear and moist. No oropharyngeal exudate.  Eyes: Conjunctivae and EOM are normal. Pupils are equal, round, and reactive to light.  Neck: Neck supple.  Cardiovascular: Normal rate and regular rhythm.   No murmur heard. Pulmonary/Chest: Effort normal and breath sounds normal. No stridor. No respiratory distress. He has no wheezes. He exhibits no tenderness.  Abdominal: Soft. There is no tenderness.  Musculoskeletal: He exhibits no edema or tenderness.  Neurological: He is alert and oriented to person, place, and time. He displays normal reflexes. No cranial nerve deficit or sensory deficit. He exhibits normal muscle tone. Coordination normal.  Skin: Skin is warm and dry. Capillary refill takes less than 2 seconds. No erythema.  Psychiatric: He has a normal mood and affect.  Nursing note and vitals reviewed.    ED Treatments / Results  Labs (  all labs ordered are listed, but only abnormal results are displayed) Labs Reviewed  CBC WITH DIFFERENTIAL/PLATELET - Abnormal; Notable for the following:       Result Value   WBC 3.8 (*)    All other components within normal limits  COMPREHENSIVE METABOLIC PANEL - Abnormal; Notable for the following:    Glucose, Bld 113 (*)    Alkaline Phosphatase 30 (*)    All other components within normal limits  LIPASE, BLOOD  TSH  I-STAT CG4 LACTIC ACID, ED    EKG  EKG Interpretation None       Radiology No results found.  Procedures Procedures (including critical care time)  Medications Ordered in ED Medications  sodium chloride 0.9 % bolus 1,000 mL (0 mLs Intravenous Stopped 07/25/16 1041)  dexamethasone (DECADRON) injection 10 mg (10 mg Intravenous Given 07/25/16 0935)  prochlorperazine (COMPAZINE) injection 10 mg (10 mg Intravenous  Given 07/25/16 0936)  diphenhydrAMINE (BENADRYL) injection 25 mg (25 mg Intravenous Given 07/25/16 0935)     Initial Impression / Assessment and Plan / ED Course  I have reviewed the triage vital signs and the nursing notes.  Pertinent labs & imaging results that were available during my care of the patient were reviewed by me and considered in my medical decision making (see chart for details).  Clinical Course     Darren Barajas is a 30 y.o. male with a past medical history significant for Graves' disease, headaches, hypertension, and prior kidney injury who presents with diarrhea, nausea, vomiting, chills, and headache.  History and physical exam are seen above.  On exam, patient has photophobia and phonophobia. Patient has no neck stiffness or nuchal rigidity. Patient has full range of motion of neck. Patient has nonfocal neurologic exam with no numbness, tingling, weakness, or coordination problems and extremities. Patient has clear lungs. Abdomen nontender. Patient refuses rectal exam.  Based on patient's history, feel patient is likely having a migraine type headache. This is likely exacerbated by a possible viral gastroenteritis causing the nausea vomiting diarrhea and possible dehydration. Suspect frequent bowel movements are causing his scant rectal bleeding.  Patient was given fluids, headache cocktail, and have some screening blood work to look for other modalities as he has had kidney problems in the past. Suspect dehydration.   Patient reported resolution and headache after medications. Laboratory testing showed normal TSH. Nonelevated lactic acid. Laboratory testing showed no evidence of acute infection. Feel patient likely has gastroenteritis.  Patient will be for Zofran and his thyroid medicine will be refilled. Patient instructed to follow up with a primary physician for further ongoing thyroid management. Patient understood return precautions. Patient discharged in  good condition with resolution of presenting symptoms.     Final Clinical Impressions(s) / ED Diagnoses   Final diagnoses:  Acute nonintractable headache, unspecified headache type  Diarrhea, unspecified type  Non-intractable vomiting with nausea, unspecified vomiting type    New Prescriptions Discharge Medication List as of 07/25/2016 12:35 PM    START taking these medications   Details  !! levothyroxine (SYNTHROID, LEVOTHROID) 50 MCG tablet Take 1 tablet (50 mcg total) by mouth daily before breakfast., Starting Tue 07/25/2016, Print    ondansetron (ZOFRAN) 4 MG tablet Take 1 tablet (4 mg total) by mouth every 6 (six) hours., Starting Tue 07/25/2016, Print     !! - Potential duplicate medications found. Please discuss with provider.      Clinical Impression: 1. Acute nonintractable headache, unspecified headache type   2. Diarrhea, unspecified  type   3. Non-intractable vomiting with nausea, unspecified vomiting type     Disposition: Discharge  Condition: Good  I have discussed the results, Dx and Tx plan with the pt(& family if present). He/she/they expressed understanding and agree(s) with the plan. Discharge instructions discussed at great length. Strict return precautions discussed and pt &/or family have verbalized understanding of the instructions. No further questions at time of discharge.    Discharge Medication List as of 07/25/2016 12:35 PM    START taking these medications   Details  !! levothyroxine (SYNTHROID, LEVOTHROID) 50 MCG tablet Take 1 tablet (50 mcg total) by mouth daily before breakfast., Starting Tue 07/25/2016, Print    ondansetron (ZOFRAN) 4 MG tablet Take 1 tablet (4 mg total) by mouth every 6 (six) hours., Starting Tue 07/25/2016, Print     !! - Potential duplicate medications found. Please discuss with provider.      Follow Up: [facility] [address] [place]  [address] [phone] Schedule an appointment as soon as possible for a visit    [facility] [id number] [place] [address] [phone]  If symptoms worsen     Heide Scales, MD 07/26/16 2118
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
## 2017-03-17 ENCOUNTER — Emergency Department (HOSPITAL_COMMUNITY)
Admission: EM | Admit: 2017-03-17 | Discharge: 2017-03-17 | Disposition: A | Discharge status: Home / Self Care | Payer: Self-pay | Attending: Emergency Medicine | Admitting: Emergency Medicine

## 2017-03-17 ENCOUNTER — Encounter (HOSPITAL_COMMUNITY): Payer: Self-pay

## 2017-03-17 ENCOUNTER — Emergency Department (HOSPITAL_COMMUNITY): Payer: Self-pay

## 2017-03-17 DIAGNOSIS — F17210 Nicotine dependence, cigarettes, uncomplicated: Secondary | ICD-10-CM | POA: Insufficient documentation

## 2017-03-17 DIAGNOSIS — R091 Pleurisy: Secondary | ICD-10-CM | POA: Insufficient documentation

## 2017-03-17 DIAGNOSIS — Z79899 Other long term (current) drug therapy: Secondary | ICD-10-CM | POA: Insufficient documentation

## 2017-03-17 DIAGNOSIS — E039 Hypothyroidism, unspecified: Secondary | ICD-10-CM | POA: Insufficient documentation

## 2017-03-17 DIAGNOSIS — I10 Essential (primary) hypertension: Secondary | ICD-10-CM | POA: Insufficient documentation

## 2017-03-17 LAB — BASIC METABOLIC PANEL
Anion gap: 11 (ref 5–15)
BUN: 6 mg/dL (ref 6–20)
CHLORIDE: 103 mmol/L (ref 101–111)
CO2: 22 mmol/L (ref 22–32)
Calcium: 9.4 mg/dL (ref 8.9–10.3)
Creatinine, Ser: 0.94 mg/dL (ref 0.61–1.24)
GFR calc Af Amer: >60 mL/min (ref >60)
GFR calc non Af Amer: >60 mL/min (ref >60)
GLUCOSE: 89 mg/dL (ref 65–99)
POTASSIUM: 3.9 mmol/L (ref 3.5–5.1)
Sodium: 136 mmol/L (ref 135–145)

## 2017-03-17 LAB — CBC
HEMATOCRIT: 41.5 % (ref 39.0–52.0)
HEMOGLOBIN: 14.4 g/dL (ref 13.0–17.0)
MCH: 31.6 pg (ref 26.0–34.0)
MCHC: 34.7 g/dL (ref 30.0–36.0)
MCV: 91.2 fL (ref 78.0–100.0)
Platelets: 249 10*3/uL (ref 150–400)
RBC: 4.55 MIL/uL (ref 4.22–5.81)
RDW: 12.6 % (ref 11.5–15.5)
WBC: 4.3 10*3/uL (ref 4.0–10.5)

## 2017-03-17 LAB — D-DIMER, QUANTITATIVE: D-Dimer, Quant: <0.27 ug/mL-FEU (ref 0.00–0.50)

## 2017-03-17 LAB — HEPATIC FUNCTION PANEL
ALBUMIN: 4.6 g/dL (ref 3.5–5.0)
ALT: 30 U/L (ref 17–63)
AST: 33 U/L (ref 15–41)
Alkaline Phosphatase: 42 U/L (ref 38–126)
BILIRUBIN DIRECT: 0.2 mg/dL (ref 0.1–0.5)
BILIRUBIN TOTAL: 1.6 mg/dL — AB (ref 0.3–1.2)
Indirect Bilirubin: 1.4 mg/dL — ABNORMAL HIGH (ref 0.3–0.9)
Total Protein: 7.9 g/dL (ref 6.5–8.1)

## 2017-03-17 LAB — I-STAT TROPONIN, ED: Troponin i, poc: 0.01 ng/mL (ref 0.00–0.08)

## 2017-03-17 LAB — LIPASE, BLOOD: Lipase: 22 U/L (ref 11–51)

## 2017-03-17 LAB — RAPID STREP SCREEN (MED CTR MEBANE ONLY): Streptococcus, Group A Screen (Direct): NEGATIVE

## 2017-03-17 MED ORDER — HYDROCHLOROTHIAZIDE 25 MG PO TABS: 25.0000 mg | ORAL_TABLET | Freq: Every day | ORAL | 1 refills | Status: DC

## 2017-03-17 MED ORDER — KETOROLAC TROMETHAMINE 30 MG/ML IJ SOLN
30.0000 mg | Freq: Once | INTRAMUSCULAR | Status: AC
  Administered 2017-03-17: 30 mg via INTRAVENOUS
  Filled 2017-03-17: qty 1

## 2017-03-17 MED ORDER — TRAMADOL HCL 50 MG PO TABS: 50.0000 mg | ORAL_TABLET | Freq: Four times a day (QID) | ORAL | 0 refills | Status: DC | PRN

## 2017-03-17 MED ORDER — MORPHINE SULFATE (PF) 4 MG/ML IV SOLN
4.0000 mg | Freq: Once | INTRAVENOUS | Status: AC
  Administered 2017-03-17: 4 mg via INTRAVENOUS
  Filled 2017-03-17: qty 1

## 2017-03-17 MED ORDER — NAPROXEN 375 MG PO TABS: 375.0000 mg | ORAL_TABLET | Freq: Two times a day (BID) | ORAL | 0 refills | Status: DC

## 2017-03-17 NOTE — ED Provider Notes (Addendum)
[facility]-EMERGENCY DEPT Provider Note   CSN: [id number] Arrival date & time: 03/17/17  1259     History   Chief Complaint Chief Complaint  Patient presents with  . Chest Pain    HPI Darren Barajas is a 31 y.o. male.  HPI Patient presents to the emergency room with complaints of sharp severe chest pain. Patient states he woke up this morning with sharp pain in the center and right side of his chest. It goes into his back. It increases when he takes a deep breath or moves around. Patient is also having pain in his upper abdomen. He also complained of earache and sore throat. Patient denies any fevers. No coughing. He denies any drug use.  Patient states he was told he had hypertensive previously but the last time he had his blood pressure checked and was normal. He does not see a primary care doctor to have it checked regularly Past Medical History:  Diagnosis Date  . Anginal pain (HCC)   . Chronic bronchitis (HCC)    "get it q couple months; q couple weeks" (04/07/2015)  . Graves' disease    treated w/radioactive ioding ablation in 2001  . Headache    "weekly" (04/07/2015)  . Hypertension    Not currently taking medications    Patient Active Problem List   Diagnosis Date Noted  . High anion gap metabolic acidosis 04/07/2015  . 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 Lactic acidosis 04/07/2015  . Hypothyroidism due to acquired atrophy of thyroid 04/07/2015  . AKI (acute kidney injury) (HCC) 04/07/2015  . History of hypertension 04/07/2015  . Contusion of right knee 04/07/2015  . Lactic acid acidosis 04/07/2015  . Acute renal failure syndrome (HCC)   . Substance abuse   . Tachycardia     Past Surgical History:  Procedure Laterality Date  . NO PAST SURGERIES         Home Medications    Prior to Admission medications   Medication Sig Start Date End Date Taking? Authorizing Provider  cephALEXin (KEFLEX) 500 MG capsule Take 1 capsule (500 mg total) by mouth 4 (four) times  daily. Patient not taking: Reported on 07/25/2016 02/26/16   Ward, Chase PicketJaime Pilcher, PA-C  diphenhydrAMINE (BENADRYL) 25 MG tablet Take 2 tablets (50 mg total) by mouth every 4 (four) hours as needed for itching. Patient not taking: Reported on 07/25/2016 02/26/16   Arthor CaptainHarris, Abigail, PA-C  HYDROcodone-acetaminophen (NORCO/VICODIN) 5-325 MG tablet Take 1 tablet by mouth every 6 (six) hours as needed for severe pain. Patient not taking: Reported on 07/25/2016 02/26/16   Ward, Chase PicketJaime Pilcher, PA-C  ibuprofen (ADVIL,MOTRIN) 800 MG tablet Take 1 tablet (800 mg total) by mouth 3 (three) times daily. Patient not taking: Reported on 07/25/2016 02/26/16   Ward, Chase PicketJaime Pilcher, PA-C  levothyroxine (SYNTHROID, LEVOTHROID) 50 MCG tablet Take 1 tablet (50 mcg total) by mouth daily before breakfast. 09/09/15   Margarita Grizzleay, Danielle, MD  levothyroxine (SYNTHROID, LEVOTHROID) 50 MCG tablet Take 1 tablet (50 mcg total) by mouth daily before breakfast. 07/25/16   Tegeler, Canary Brimhristopher J, MD  Multiple Vitamins-Minerals (MULTIVITAMIN ADULT PO) Take 1 tablet by mouth daily.    [provider]  ondansetron (ZOFRAN) 4 MG tablet Take 1 tablet (4 mg total) by mouth every 6 (six) hours. 07/25/16   Tegeler, Canary Brimhristopher J, MD  promethazine (PHENERGAN) 12.5 MG tablet Take 1 tablet (12.5 mg total) by mouth every 8 (eight) hours as needed for nausea or vomiting. Patient not taking: Reported on 07/25/2016 10/19/15  Audry Pili, PA-C    Family History Family History  Problem Relation Age of Onset  . Hyperthyroidism Mother   . Hyperthyroidism Maternal Uncle     Social History Social History  Substance Use Topics  . Smoking status: Current Every Day Smoker    Packs/day: 1.00    Years: 10.00    Types: Cigarettes  . Smokeless tobacco: Never Used  . Alcohol use 21.6 oz/week    36 Cans of beer per week     Comment: 04/07/2015 "12 pack beer 2-3 days/wk"     Allergies   Patient has no known allergies.   Review of  Systems Review of Systems  Constitutional: Negative for fever.  Gastrointestinal: Negative for diarrhea and vomiting.  Genitourinary: Negative for dysuria.  All other systems reviewed and are negative.    Physical Exam Updated Vital Signs BP (!) 175/121 (BP Location: Left Arm) Comment: Simultaneous filing. User may not have seen previous data.  Pulse 73 Comment: Simultaneous filing. User may not have seen previous data.  Temp 98.9 F (37.2 C) (Oral)   Resp 12 Comment: Simultaneous filing. User may not have seen previous data.  Ht 1.829 m (6')   Wt 90.7 kg (200 lb)   SpO2 100% Comment: Simultaneous filing. User may not have seen previous data.  BMI 27.12 kg/m   Physical Exam  Constitutional: He appears well-developed and well-nourished. No distress.  HENT:  Head: Normocephalic and atraumatic.  Right Ear: External ear normal.  Left Ear: External ear normal.  Eyes: Conjunctivae are normal. Right eye exhibits no discharge. Left eye exhibits no discharge. No scleral icterus.  Neck: Neck supple. No tracheal deviation present.  Cardiovascular: Normal rate, regular rhythm and intact distal pulses.   Pulmonary/Chest: Effort normal and breath sounds normal. No stridor. No respiratory distress. He has no wheezes. He has no rales.  Abdominal: Soft. Bowel sounds are normal. He exhibits no distension. There is tenderness in the right upper quadrant and epigastric area. There is no rebound and no guarding.  Musculoskeletal: He exhibits no edema or tenderness.  Neurological: He is alert. He has normal strength. No cranial nerve deficit (no facial droop, extraocular movements intact, no slurred speech) or sensory deficit. He exhibits normal muscle tone. He displays no seizure activity. Coordination normal.  Skin: Skin is warm and dry. No rash noted.  Psychiatric: He has a normal mood and affect.  Nursing note and vitals reviewed.    ED Treatments / Results  Labs (all labs ordered are  listed, but only abnormal results are displayed) Labs Reviewed  RAPID STREP SCREEN (NOT AT [facility])  CULTURE, GROUP A STREP [facility])  BASIC METABOLIC PANEL  CBC  HEPATIC FUNCTION PANEL  LIPASE, BLOOD  D-DIMER, QUANTITATIVE (NOT AT [facility])  I-STAT TROPOININ, ED    EKG  EKG Interpretation  Date/Time:  Saturday March 17 2017 13:03:48 EDT Ventricular Rate:  91 PR Interval:  138 QRS Duration: 110 QT Interval:  374 QTC Calculation: 460 R Axis:   30 Text Interpretation:  Normal sinus rhythm Normal ECG Since last tracing rate slower Confirmed by Linwood Dibbles [phone]) on 03/17/2017 2:43:34 PM       Radiology Dg Chest 2 View  Result Date: 03/17/2017 CLINICAL DATA:  Patient with right-sided chest pain radiating to the epigastric location. Shortness of breath. EXAM: CHEST  2 VIEW COMPARISON:  Chest radiograph 04/07/2015 FINDINGS: Normal cardiac and mediastinal contours. No consolidative pulmonary opacities. No pleural effusion or pneumothorax. Thoracic spine degenerative changes. Lateral view limited  due to overlapping soft tissues. IMPRESSION: No acute cardiopulmonary process. Electronically Signed   By: Annia Belt M.D.   On: 03/17/2017 13:36    Procedures Procedures (including critical care time)  Medications Ordered in ED Medications  morphine 4 MG/ML injection 4 mg (not administered)     Initial Impression / Assessment and Plan / ED Course  I have reviewed the triage vital signs and the nursing notes.  Pertinent labs & imaging results that were available during my care of the patient were reviewed by me and considered in my medical decision making (see chart for details).  Clinical Course as of Mar 17 1548  Sat Mar 17, 2017  1450 CBC BMET and trop are normal.  CXR normal  [JK]    Clinical Course User Index [JK] Linwood Dibbles, MD  Pt presents with sharp chest pain.  ON exam he has ttp in the chest wall and in the epigastric region.  He is hypertensive in the ED.  Previous vital signs  showed htn but the last visit his bp was in the 130s/90s.  Will add on lfts and lipase.  Will add on d dimer considering the pleuritic nature.  Will treat pain and monitor BP.    BP improved with pain management.  Labs are all normal.  Cardiac enzymes and d-dimer are normal. I doubt acute coronary syndrome, pulmonary embolism, aortic dissection, biliary colic, pancreatitis or other emergent etiology.  Suspect pleurisy. We'll discharge home with pain medications and we discussed the importance of outpatient follow-up regarding his hypertension. Final Clinical Impressions(s) / ED Diagnoses   Pleurisy, HTN   Linwood Dibbles, MD 03/17/17 1549    Linwood Dibbles, MD 03/17/17 [phone]

## 2017-03-17 NOTE — ED Notes (Signed)
edp made aware of pts BP, pt was given RX for HCTZ and instructed on follow up plan

## 2017-03-17 NOTE — Discharge Instructions (Signed)
Take medications as prescribed, follow up with a primary care doctor, return as needed for worsening symptoms

## 2017-03-17 NOTE — ED Triage Notes (Signed)
Pt reports sharp right sided chest pain that radiates to epigastric area and to right lateral back associated with SOB. Denies n/v/fever/chills. He reports sore throat and pain with swallowing.

## 2017-03-19 LAB — CULTURE, GROUP A STREP (THRC)

## 2017-04-02 ENCOUNTER — Ambulatory Visit: Payer: Self-pay | Attending: Internal Medicine | Admitting: Physician Assistant

## 2017-04-02 VITALS — BP 144/97 | HR 68 | Temp 98.5°F | Ht 72.0 in | Wt 201.0 lb

## 2017-04-02 DIAGNOSIS — M25512 Pain in left shoulder: Secondary | ICD-10-CM | POA: Insufficient documentation

## 2017-04-02 DIAGNOSIS — J449 Chronic obstructive pulmonary disease, unspecified: Secondary | ICD-10-CM | POA: Insufficient documentation

## 2017-04-02 DIAGNOSIS — I10 Essential (primary) hypertension: Secondary | ICD-10-CM | POA: Insufficient documentation

## 2017-04-02 DIAGNOSIS — R091 Pleurisy: Secondary | ICD-10-CM | POA: Insufficient documentation

## 2017-04-02 DIAGNOSIS — Z79899 Other long term (current) drug therapy: Secondary | ICD-10-CM | POA: Insufficient documentation

## 2017-04-02 DIAGNOSIS — M545 Low back pain: Secondary | ICD-10-CM | POA: Insufficient documentation

## 2017-04-02 DIAGNOSIS — G8929 Other chronic pain: Secondary | ICD-10-CM | POA: Insufficient documentation

## 2017-04-02 DIAGNOSIS — R0789 Other chest pain: Secondary | ICD-10-CM

## 2017-04-02 DIAGNOSIS — E0500 Thyrotoxicosis with diffuse goiter without thyrotoxic crisis or storm: Secondary | ICD-10-CM | POA: Insufficient documentation

## 2017-04-02 MED ORDER — METHOCARBAMOL 500 MG PO TABS: 500.0000 mg | ORAL_TABLET | Freq: Three times a day (TID) | ORAL | 0 refills | Status: DC

## 2017-04-02 MED ORDER — ALBUTEROL SULFATE HFA 108 (90 BASE) MCG/ACT IN AERS: 2.0000 | INHALATION_SPRAY | Freq: Four times a day (QID) | RESPIRATORY_TRACT | 1 refills | Status: DC | PRN

## 2017-04-02 MED ORDER — IBUPROFEN 800 MG PO TABS: 800.0000 mg | ORAL_TABLET | Freq: Three times a day (TID) | ORAL | 0 refills | Status: DC

## 2017-04-02 MED ORDER — LEVOTHYROXINE SODIUM 50 MCG PO TABS: 50.0000 ug | ORAL_TABLET | Freq: Every day | ORAL | 2 refills | Status: DC

## 2017-04-02 NOTE — Progress Notes (Signed)
Darren Sorrowatrick Henneman  NFA:[id number]  ION:[id number]  DOB - 06-22-1986  Chief Complaint  Patient presents with  . Hospitalization Follow-up    Pleurisy       Subjective:   Darren Barajas is a 31 y.o. male here today for establishment of care. He has a history of Graves' disease, hypertension, asthma and chronic pain. He presented to the emergency department on 03/17/2017 with chest pain. He describes a sharp, severe central chest discomfort with radiation to his back. Self-medicating with Tylenol and Goody powders with temporary relief.  He would take a deep breath. He will hurt when he would lay in certain positions. His blood pressure was elevated at 175/121. Other vital signs okay. His EKG showed sinus tachycardia. Troponin negative. D-dimer negative. Chest x-ray negative for an acute process. He believes that he will be better if he still had his inhaler. Given HCTZ, tramadol and Naprosyn   He has new complaint today is pain in his left shoulder and bilateral lower back. Naprosyn and tramadol not helping. Believes he had more success with controlling pain in past with high dose antiinflammatories. He lifts a lot of heavy items with work. He smokes.   ROS: GEN: denies fever or chills, denies change in weight Skin: denies lesions or rashes HEENT: denies headache, earache, epistaxis, sore throat, or neck pain LUNGS: denies SHOB, dyspnea, PND, orthopnea CV: + CP or palpitations ABD: denies abd pain, N or V EXT: + muscle spasms or swelling; no pain in lower ext, no weakness NEURO: denies numbness or tingling, denies sz, stroke or TIA  ALLERGIES: No Known Allergies  PAST MEDICAL HISTORY: Past Medical History:  Diagnosis Date  . Anginal pain (HCC)   . Chronic bronchitis (HCC)    "get it q couple months; q couple weeks" (04/07/2015)  . Graves' disease    treated w/radioactive ioding ablation in 2001  . Headache    "weekly" (04/07/2015)  . Hypertension    Not currently taking  medications    PAST SURGICAL HISTORY: Past Surgical History:  Procedure Laterality Date  . NO PAST SURGERIES      MEDICATIONS AT HOME: Prior to Admission medications   Medication Sig Start Date End Date Taking? Authorizing Provider  diphenhydrAMINE (BENADRYL) 25 MG tablet Take 2 tablets (50 mg total) by mouth every 4 (four) hours as needed for itching. 02/26/16  Yes Harris, Abigail, PA-C  hydrochlorothiazide (HYDRODIURIL) 25 MG tablet Take 1 tablet (25 mg total) by mouth daily. 03/17/17  Yes Linwood DibblesKnapp, Jon, MD  HYDROcodone-acetaminophen (NORCO/VICODIN) 5-325 MG tablet Take 1 tablet by mouth every 6 (six) hours as needed for severe pain. 02/26/16  Yes Ward, Chase PicketJaime Pilcher, PA-C  ibuprofen (ADVIL,MOTRIN) 800 MG tablet Take 1 tablet (800 mg total) by mouth 3 (three) times daily. 04/02/17  Yes Danelle EarthlyNoel, Emiko Osorto S, PA-C  levothyroxine (SYNTHROID, LEVOTHROID) 50 MCG tablet Take 1 tablet (50 mcg total) by mouth daily before breakfast. 04/02/17  Yes Danelle EarthlyNoel, Laneka Mcgrory S, PA-C  Multiple Vitamins-Minerals (MULTIVITAMIN ADULT PO) Take 1 tablet by mouth daily.   Yes [provider]  albuterol (PROVENTIL HFA) 108 (90 Base) MCG/ACT inhaler Inhale 2 puffs into the lungs every 6 (six) hours as needed for wheezing or shortness of breath. 04/02/17   Vivianne MasterNoel, Eugenio Dollins S, PA-C  cephALEXin (KEFLEX) 500 MG capsule Take 1 capsule (500 mg total) by mouth 4 (four) times daily. Patient not taking: Reported on 04/02/2017 02/26/16   Ward, Chase PicketJaime Pilcher, PA-C  methocarbamol (ROBAXIN) 500 MG tablet Take 1 tablet (500  mg total) by mouth 3 (three) times daily. 04/02/17   Vivianne Master, PA-C  ondansetron (ZOFRAN) 4 MG tablet Take 1 tablet (4 mg total) by mouth every 6 (six) hours. Patient not taking: Reported on 04/02/2017 07/25/16   Tegeler, Canary Brim, MD  promethazine (PHENERGAN) 12.5 MG tablet Take 1 tablet (12.5 mg total) by mouth every 8 (eight) hours as needed for nausea or vomiting. Patient not taking: Reported on 07/25/2016  10/19/15   Audry Pili, PA-C    Family History  Problem Relation Age of Onset  . Hyperthyroidism Mother   . Hyperthyroidism Maternal Uncle     Social-unmarried, smokes, works  Objective:   Vitals:   04/02/17 1107  BP: (!) 144/97  Pulse: 68  Temp: 98.5 F (36.9 C)  TempSrc: Oral  SpO2: 97%  Weight: 201 lb (91.2 kg)  Height: 6' (1.829 m)    Exam General appearance : Awake, alert, not in any distress. Speech Clear. Not toxic looking HEENT: Atraumatic and Normocephalic, pupils equally reactive to light and accomodation Neck: supple, no JVD. No cervical lymphadenopathy.  Chest:Good air entry bilaterally, no added sounds  CVS: S1 S2 regular, no murmurs.  Abdomen: Bowel sounds present, Non tender and not distended with no guarding, rigidity or rebound. Extremities: B/L Lower Ext shows no edema, both legs are warm to touch Neurology: Awake alert, and oriented X 3, CN II-XII intact, Non focal Skin:No Rash Wounds:N/A  Assessment & Plan  1. Acute on chronic back/shouder pain  -NSAIDS  -Robaxin  -Avoid triggers 2. HTN  -Cont HCTZ   -DASH diet  -increase exercise 3. Smoker  -not ready to quit 4. Pleuritic CP/Pleurisy  -Albuterol MDI refill  -NSAIDS   Return in about 3 weeks (around 04/23/2017).  The patient was given clear instructions to go to ER or return to medical center if symptoms don't improve, worsen or new problems develop. The patient verbalized understanding. The patient was told to call to get lab results if they haven't heard anything in the next week.    This note has been created with Education officer, environmental. Any transcriptional errors are unintentional.    Scot Jun, PA-C [facility] and [facility] [place], [place] [phone]   04/02/2017, 11:44 AM

## 2017-04-02 NOTE — Patient Instructions (Signed)
Stop smoking

## 2017-04-15 ENCOUNTER — Encounter (HOSPITAL_COMMUNITY): Payer: Self-pay | Admitting: *Deleted

## 2017-04-15 ENCOUNTER — Emergency Department (HOSPITAL_COMMUNITY)
Admission: EM | Admit: 2017-04-15 | Discharge: 2017-04-15 | Disposition: A | Discharge status: Home / Self Care | Payer: PRIVATE HEALTH INSURANCE | Attending: Emergency Medicine | Admitting: Emergency Medicine

## 2017-04-15 DIAGNOSIS — F17210 Nicotine dependence, cigarettes, uncomplicated: Secondary | ICD-10-CM | POA: Diagnosis not present

## 2017-04-15 DIAGNOSIS — Z79899 Other long term (current) drug therapy: Secondary | ICD-10-CM | POA: Diagnosis not present

## 2017-04-15 DIAGNOSIS — K921 Melena: Secondary | ICD-10-CM | POA: Insufficient documentation

## 2017-04-15 DIAGNOSIS — M545 Low back pain, unspecified: Secondary | ICD-10-CM

## 2017-04-15 DIAGNOSIS — E039 Hypothyroidism, unspecified: Secondary | ICD-10-CM | POA: Insufficient documentation

## 2017-04-15 DIAGNOSIS — R0789 Other chest pain: Secondary | ICD-10-CM | POA: Diagnosis not present

## 2017-04-15 DIAGNOSIS — I10 Essential (primary) hypertension: Secondary | ICD-10-CM | POA: Insufficient documentation

## 2017-04-15 DIAGNOSIS — K5900 Constipation, unspecified: Secondary | ICD-10-CM | POA: Insufficient documentation

## 2017-04-15 LAB — CBC
HCT: 39.1 % (ref 39.0–52.0)
Hemoglobin: 13.5 g/dL (ref 13.0–17.0)
MCH: 31.3 pg (ref 26.0–34.0)
MCHC: 34.5 g/dL (ref 30.0–36.0)
MCV: 90.5 fL (ref 78.0–100.0)
PLATELETS: 244 10*3/uL (ref 150–400)
RBC: 4.32 MIL/uL (ref 4.22–5.81)
RDW: 13 % (ref 11.5–15.5)
WBC: 3.8 10*3/uL — AB (ref 4.0–10.5)

## 2017-04-15 LAB — COMPREHENSIVE METABOLIC PANEL
ALK PHOS: 38 U/L (ref 38–126)
ALT: 26 U/L (ref 17–63)
AST: 23 U/L (ref 15–41)
Albumin: 4.3 g/dL (ref 3.5–5.0)
Anion gap: 10 (ref 5–15)
BILIRUBIN TOTAL: 1 mg/dL (ref 0.3–1.2)
BUN: 14 mg/dL (ref 6–20)
CO2: 24 mmol/L (ref 22–32)
CREATININE: 0.89 mg/dL (ref 0.61–1.24)
Calcium: 9.5 mg/dL (ref 8.9–10.3)
Chloride: 103 mmol/L (ref 101–111)
Glucose, Bld: 103 mg/dL — ABNORMAL HIGH (ref 65–99)
Potassium: 3.5 mmol/L (ref 3.5–5.1)
Sodium: 137 mmol/L (ref 135–145)
Total Protein: 6.9 g/dL (ref 6.5–8.1)

## 2017-04-15 MED ORDER — NAPROXEN 250 MG PO TABS
375.0000 mg | ORAL_TABLET | Freq: Once | ORAL | Status: AC
  Administered 2017-04-15: 375 mg via ORAL
  Filled 2017-04-15: qty 2

## 2017-04-15 MED ORDER — NAPROXEN 375 MG PO TABS: 375.0000 mg | ORAL_TABLET | Freq: Two times a day (BID) | ORAL | 0 refills | Status: DC

## 2017-04-15 MED ORDER — CYCLOBENZAPRINE HCL 10 MG PO TABS
10.0000 mg | ORAL_TABLET | Freq: Once | ORAL | Status: AC
  Administered 2017-04-15: 10 mg via ORAL
  Filled 2017-04-15: qty 1

## 2017-04-15 MED ORDER — CYCLOBENZAPRINE HCL 10 MG PO TABS: 5.0000 mg | ORAL_TABLET | Freq: Three times a day (TID) | ORAL | 0 refills | Status: DC | PRN

## 2017-04-15 NOTE — ED Notes (Signed)
Declined W/C at D/C and was escorted to lobby by RN. 

## 2017-04-15 NOTE — ED Triage Notes (Signed)
Pt reports ongoing neck and back pain. Also having blood in his stools when he has a bowel movement. No acute distress is noted at triage.

## 2017-04-15 NOTE — Discharge Instructions (Signed)
Start taking miralax, available over the counter 2-3 times daily until multiple regular bowel movements.

## 2017-04-15 NOTE — ED Provider Notes (Signed)
[facility]-EMERGENCY DEPT Provider Note   CSN: [id number] Arrival date & time: 04/15/17  1447     History   Chief Complaint Chief Complaint  Patient presents with  . Back Pain  . Rectal Bleeding    HPI Darren Patesatrick A Laymon is a 31 y.o. male.  The history is provided by the patient. No language interpreter was used.  Back Pain    Rectal Bleeding    Darren Barajas is a 31 y.o. male who presents to the Emergency Department complaining of rectal bleeding, back pain.  She reports 1 week of hematochezia with blood that fills the toilet. He endorses straining with bowel movements but no rectal pain. He denies any shortness of breath or near syncopal type sensation. He does endorse chronic back pain and neck pain. The pain is located in the mid neck as well as bilateral low back. Pain is been present for the last 1-2 months and is worse with bending activities. He has been taking ibuprofen and muscle relaxants with no significant change in his symptoms. He denies any numbness or weakness. He also has intermittent left-sided chest pain that is worse with deep breaths. This has also been present for the last several months. He does not take aspirin or Goody's powders. 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 He does work out regularly and does pushups frequently. He denies any tobacco, alcohol, drug use.  Past Medical History:  Diagnosis Date  . Anginal pain (HCC)   . Chronic bronchitis (HCC)    "get it q couple months; q couple weeks" (04/07/2015)  . Graves' disease    treated w/radioactive ioding ablation in 2001  . Headache    "weekly" (04/07/2015)  . Hypertension    Not currently taking medications    Patient Active Problem List   Diagnosis Date Noted  . High anion gap metabolic acidosis 04/07/2015  . 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 Lactic acidosis 04/07/2015  . Hypothyroidism due to acquired atrophy of thyroid 04/07/2015  . AKI (acute kidney  injury) (HCC) 04/07/2015  . History of hypertension 04/07/2015  . Contusion of right knee 04/07/2015  . Lactic acid acidosis 04/07/2015  . Acute renal failure syndrome (HCC)   . Substance abuse   . Tachycardia     Past Surgical History:  Procedure Laterality Date  . NO PAST SURGERIES         Home Medications    Prior to Admission medications   Medication Sig Start Date End Date Taking? Authorizing Provider  albuterol (PROVENTIL HFA) 108 (90 Base) MCG/ACT inhaler Inhale 2 puffs into the lungs every 6 (six) hours as needed for wheezing or shortness of breath. 04/02/17   Vivianne MasterNoel, Tiffany S, PA-C  cephALEXin (KEFLEX) 500 MG capsule Take 1 capsule (500 mg total) by mouth 4 (four) times daily. Patient not taking: Reported on 04/02/2017 02/26/16   Ward, Chase PicketJaime Pilcher, PA-C  cyclobenzaprine (FLEXERIL) 10 MG tablet Take 0.5-1 tablets (5-10 mg total) by mouth 3 (three) times daily as needed for muscle spasms. 04/15/17   Tilden Fossaees, Giovannie Scerbo, MD  diphenhydrAMINE (BENADRYL) 25 MG tablet Take 2 tablets (50 mg total) by mouth every 4 (four) hours as needed for itching. 02/26/16   Arthor CaptainHarris, Abigail, PA-C  hydrochlorothiazide (HYDRODIURIL) 25 MG tablet Take 1 tablet (25 mg total) by mouth daily. 03/17/17   Linwood DibblesKnapp, Jon, MD  levothyroxine (SYNTHROID, LEVOTHROID) 50 MCG tablet Take 1 tablet (50 mcg total) by mouth daily before breakfast. 04/02/17  Vivianne Master, PA-C  Multiple Vitamins-Minerals (MULTIVITAMIN ADULT PO) Take 1 tablet by mouth daily.    [provider]  naproxen (NAPROSYN) 375 MG tablet Take 1 tablet (375 mg total) by mouth 2 (two) times daily. 04/15/17   Tilden Fossa, MD  ondansetron (ZOFRAN) 4 MG tablet Take 1 tablet (4 mg total) by mouth every 6 (six) hours. Patient not taking: Reported on 04/02/2017 07/25/16   Tegeler, Canary Brim, MD  promethazine (PHENERGAN) 12.5 MG tablet Take 1 tablet (12.5 mg total) by mouth every 8 (eight) hours as needed for nausea or vomiting. Patient not taking:  Reported on 07/25/2016 10/19/15   Audry Pili, PA-C    Family History Family History  Problem Relation Age of Onset  . Hyperthyroidism Mother   . Hyperthyroidism Maternal Uncle     Social History Social History  Substance Use Topics  . Smoking status: Current Every Day Smoker    Packs/day: 1.00    Years: 10.00    Types: Cigarettes  . Smokeless tobacco: Never Used  . Alcohol use 21.6 oz/week    36 Cans of beer per week     Comment: 04/07/2015 "12 pack beer 2-3 days/wk"     Allergies   Patient has no known allergies.   Review of Systems Review of Systems  Gastrointestinal: Positive for hematochezia.  Musculoskeletal: Positive for back pain.  All other systems reviewed and are negative.    Physical Exam Updated Vital Signs BP (!) 157/105 (BP Location: Right Arm)   Pulse 64   Temp 99.3 F (37.4 C) (Oral)   Resp 18   SpO2 97%   Physical Exam  Constitutional: He is oriented to person, place, and time. He appears well-developed and well-nourished.  HENT:  Head: Normocephalic and atraumatic.  Cardiovascular: Normal rate and regular rhythm.   No murmur heard. Pulmonary/Chest: Effort normal and breath sounds normal. No respiratory distress.  Abdominal: Soft. There is no tenderness. There is no rebound and no guarding.  Genitourinary:  Genitourinary Comments: Rectal examination chaperoned by PA, Michela Pitcher. No external hemorrhoids on examination. There is some external rectal tenderness with no gross blood.  Musculoskeletal: He exhibits no edema.  Mild tenderness to the mid neck and bilateral low back. No midline thoracic or lumbar tenderness. 2+ radial pulses bilaterally  Neurological: He is alert and oriented to person, place, and time.  5 out of 5 strength in all 4 extremities. Sensation to light touch intact in all 4 extremities  Skin: Skin is warm and dry.  Psychiatric: He has a normal mood and affect. His behavior is normal.  Nursing note and vitals  reviewed.    ED Treatments / Results  Labs (all labs ordered are listed, but only abnormal results are displayed) Labs Reviewed  COMPREHENSIVE METABOLIC PANEL - Abnormal; Notable for the following:       Result Value   Glucose, Bld 103 (*)    All other components within normal limits  CBC - Abnormal; Notable for the following:    WBC 3.8 (*)    All other components within normal limits    EKG  EKG Interpretation None       Radiology No results found.  Procedures Procedures (including critical care time)  Medications Ordered in ED Medications  cyclobenzaprine (FLEXERIL) tablet 10 mg (not administered)  naproxen (NAPROSYN) tablet 375 mg (not administered)     Initial Impression / Assessment and Plan / ED Course  I have reviewed the triage vital signs and the  nursing notes.  Pertinent labs & imaging results that were available during my care of the patient were reviewed by me and considered in my medical decision making (see chart for details).     Patient here for evacuation of hematochezia as well as back pain. He is nontoxic appearing on examination with no distress. Presentation is not consistent with dissection, AAA, pyelonephritis, epidural abscess. Counseled patient on home care for back pain with rest followed by gentle activity, PCP follow-up for possible physical therapy. Providing prescription for muscle relaxants and anti-inflammatories. In terms of hematochezia, no evidence of major hemorrhage based on history, labs and examination. He does report bright red blood per rectum, presentation is not consistent with bleeding peptic ulcer. Patient with constipation on history, discussed the patient home care for constipation to limit his straining. Discussed GI follow-up as well as return precautions.  Final Clinical Impressions(s) / ED Diagnoses   Final diagnoses:  Acute bilateral low back pain without sciatica  Constipation, unspecified constipation type   Hematochezia    New Prescriptions New Prescriptions   CYCLOBENZAPRINE (FLEXERIL) 10 MG TABLET    Take 0.5-1 tablets (5-10 mg total) by mouth 3 (three) times daily as needed for muscle spasms.   NAPROXEN (NAPROSYN) 375 MG TABLET    Take 1 tablet (375 mg total) by mouth 2 (two) times daily.     Tilden Fossaees, Zuzu Befort, MD 04/15/17 [phone]

## 2017-05-24 ENCOUNTER — Telehealth: Payer: Self-pay | Admitting: General Practice

## 2017-05-24 MED ORDER — HYDROCHLOROTHIAZIDE 25 MG PO TABS: 25.0000 mg | ORAL_TABLET | Freq: Every day | ORAL | 1 refills | Status: DC

## 2017-05-24 NOTE — Telephone Encounter (Signed)
Pt call to request a refill for hydrochlorothiazide (HYDRODIURIL) 25 MG tablet   since his next appt is on 06/28/17 and wont have enough med until them, please sent it to [facility] on [address], please follow

## 2017-05-24 NOTE — Telephone Encounter (Signed)
Refilled - must have office visit for further refills. 

## 2017-06-28 ENCOUNTER — Ambulatory Visit: Payer: PRIVATE HEALTH INSURANCE | Attending: Family Medicine | Admitting: Family Medicine

## 2017-06-28 ENCOUNTER — Encounter: Payer: Self-pay | Admitting: Family Medicine

## 2017-06-28 DIAGNOSIS — E034 Atrophy of thyroid (acquired): Secondary | ICD-10-CM | POA: Diagnosis not present

## 2017-06-28 DIAGNOSIS — G43709 Chronic migraine without aura, not intractable, without status migrainosus: Secondary | ICD-10-CM

## 2017-06-28 DIAGNOSIS — E0500 Thyrotoxicosis with diffuse goiter without thyrotoxic crisis or storm: Secondary | ICD-10-CM | POA: Insufficient documentation

## 2017-06-28 DIAGNOSIS — Z9119 Patient's noncompliance with other medical treatment and regimen: Secondary | ICD-10-CM | POA: Insufficient documentation

## 2017-06-28 DIAGNOSIS — J42 Unspecified chronic bronchitis: Secondary | ICD-10-CM | POA: Diagnosis not present

## 2017-06-28 DIAGNOSIS — I10 Essential (primary) hypertension: Secondary | ICD-10-CM

## 2017-06-28 DIAGNOSIS — G8929 Other chronic pain: Secondary | ICD-10-CM | POA: Diagnosis not present

## 2017-06-28 DIAGNOSIS — Z79899 Other long term (current) drug therapy: Secondary | ICD-10-CM | POA: Diagnosis not present

## 2017-06-28 DIAGNOSIS — M545 Low back pain, unspecified: Secondary | ICD-10-CM

## 2017-06-28 DIAGNOSIS — R110 Nausea: Secondary | ICD-10-CM | POA: Insufficient documentation

## 2017-06-28 DIAGNOSIS — M549 Dorsalgia, unspecified: Secondary | ICD-10-CM | POA: Insufficient documentation

## 2017-06-28 MED ORDER — TOPIRAMATE 50 MG PO TABS: 50.0000 mg | ORAL_TABLET | Freq: Two times a day (BID) | ORAL | 3 refills | Status: DC

## 2017-06-28 MED ORDER — METHOCARBAMOL 750 MG PO TABS: 750.0000 mg | ORAL_TABLET | Freq: Two times a day (BID) | ORAL | 3 refills | Status: DC | PRN

## 2017-06-28 MED ORDER — PROPRANOLOL HCL 20 MG PO TABS: 20.0000 mg | ORAL_TABLET | Freq: Two times a day (BID) | ORAL | 3 refills | Status: DC

## 2017-06-28 MED ORDER — MELOXICAM 7.5 MG PO TABS: 7.5000 mg | ORAL_TABLET | Freq: Every day | ORAL | 1 refills | Status: DC

## 2017-06-28 MED ORDER — LEVOTHYROXINE SODIUM 50 MCG PO TABS: 50.0000 ug | ORAL_TABLET | Freq: Every day | ORAL | 3 refills | Status: DC

## 2017-06-28 MED ORDER — KETOROLAC TROMETHAMINE 60 MG/2ML IM SOLN
60.0000 mg | Freq: Once | INTRAMUSCULAR | Status: AC
  Administered 2017-06-28: 60 mg via INTRAMUSCULAR

## 2017-06-28 NOTE — Progress Notes (Signed)
Has not eaten F/u HTN, back pain, and headaches Med RF

## 2017-06-28 NOTE — Patient Instructions (Signed)

## 2017-06-28 NOTE — Progress Notes (Signed)
Subjective:  Patient ID: Darren Barajas, male    DOB: 1986-03-01  Age: 31 y.o. MRN: [id number]  CC: Follow-up   HPI Darren Barajas is a 31 year old male with a history of Hypertension, migraines, Hypothyroidism who presents today to get established with me.  He complains of daily migraines with associated nausea, photophobia, neck pains but denies aura; currently has a migraine now rated as a 10/10 and  Takes goody powders with no relief.  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 Pain is rated as moderate and does not radiate, he has no associated numbness in his legs or loss of sphincteric function. The Naproxen he received at his visit with the PA has not been helpful. He denies a history of trauma.  He has not been compliant with his Levothyroxine as he sometimes has to take his mom's pills due to running out. He is out of his antihypertensive hence his elevated BP.  Past Medical History:  Diagnosis Date  . Anginal pain ([place])   . Chronic bronchitis ([place])    "get it q couple months; q couple weeks" (04/07/2015)  . Graves' disease    treated w/radioactive ioding ablation in 2001  . Headache    "weekly" (04/07/2015)  . Hypertension    Not currently taking medications    Past Surgical History:  Procedure Laterality Date  . NO PAST SURGERIES      No Known Allergies   Outpatient Medications Prior to Visit  Medication Sig Dispense Refill  . Multiple Vitamins-Minerals (MULTIVITAMIN ADULT PO) Take 1 tablet by mouth daily.    . hydrochlorothiazide (HYDRODIURIL) 25 MG tablet Take 1 tablet (25 mg total) by mouth daily. 30 tablet 1  . levothyroxine (SYNTHROID, LEVOTHROID) 50 MCG tablet Take 1 tablet (50 mcg total) by mouth daily before breakfast. 60 tablet 2  . albuterol (PROVENTIL HFA) 108 (90 Base) MCG/ACT inhaler Inhale 2 puffs into the lungs every 6 (six) hours as  needed for wheezing or shortness of breath. (Patient not taking: Reported on 06/28/2017) 1 Inhaler 1  . cephALEXin (KEFLEX) 500 MG capsule Take 1 capsule (500 mg total) by mouth 4 (four) times daily. (Patient not taking: Reported on 04/02/2017) 40 capsule 0  . cyclobenzaprine (FLEXERIL) 10 MG tablet Take 0.5-1 tablets (5-10 mg total) by mouth 3 (three) times daily as needed for muscle spasms. 10 tablet 0  . diphenhydrAMINE (BENADRYL) 25 MG tablet Take 2 tablets (50 mg total) by mouth every 4 (four) hours as needed for itching. 20 tablet 0  . naproxen (NAPROSYN) 375 MG tablet Take 1 tablet (375 mg total) by mouth 2 (two) times daily. (Patient not taking: Reported on 06/28/2017) 14 tablet 0  . ondansetron (ZOFRAN) 4 MG tablet Take 1 tablet (4 mg total) by mouth every 6 (six) hours. (Patient not taking: Reported on 04/02/2017) 12 tablet 0  . promethazine (PHENERGAN) 12.5 MG tablet Take 1 tablet (12.5 mg total) by mouth every 8 (eight) hours as needed for nausea or vomiting. (Patient not taking: Reported on 07/25/2016) 10 tablet 0   No facility-administered medications prior to visit.     ROS Review of Systems  Constitutional: Negative for activity change and appetite change.  HENT: Negative for sinus pressure and sore throat.   Eyes: Negative for visual disturbance.  Respiratory: Negative for cough, chest tightness and shortness of breath.  Cardiovascular: Negative for chest pain and leg swelling.  Gastrointestinal: Negative for abdominal distention, abdominal pain, constipation and diarrhea.  Endocrine: Negative.   Genitourinary: Negative for dysuria.  Musculoskeletal: Positive for back pain. Negative for joint swelling and myalgias.  Skin: Negative for rash.  Allergic/Immunologic: Negative.   Neurological: Positive for headaches. Negative for weakness, light-headedness and numbness.  Psychiatric/Behavioral: Negative for dysphoric mood and suicidal ideas.    Objective:  BP (!) 147/95 (BP  Location: Right Arm, Patient Position: Sitting, Cuff Size: Large)   Pulse 61   Temp 98.4 F (36.9 C) (Oral)   Resp 20   Ht 6' (1.829 m)   Wt 208 lb (94.3 kg)   SpO2 99%   BMI 28.21 kg/m   BP/Weight 06/28/2017 04/15/2017 0/80/2233  Systolic BP 612 244 975  Diastolic BP 95 300 97  Wt. (Lbs) 208 - 201  BMI 28.21 - 27.26      Physical Exam  Constitutional: He is oriented to person, place, and time. He appears well-developed and well-nourished.  Cardiovascular: Normal rate, normal heart sounds and intact distal pulses.   No murmur heard. Pulmonary/Chest: Effort normal and breath sounds normal. He has no wheezes. He has no rales. He exhibits no tenderness.  Abdominal: Soft. Bowel sounds are normal. He exhibits no distension and no mass. There is no tenderness.  Musculoskeletal: Normal range of motion. He exhibits tenderness (mild tenderness across lumbar spine; negative straight leg raise).  Neurological: He is alert and oriented to person, place, and time.  Psychiatric: He has a normal mood and affect.     Assessment & Plan:   1. Chronic migraine without aura without status migrainosus, not intractable Uncontrolled Toradol 58m IM administered Commenced on prophylactic medications and advised to titrate to 1030mbid if uncontrolled Will add on Imitrex if uncontrolled - topiramate (TOPAMAX) 50 MG tablet; Take 1 tablet (50 mg total) by mouth 2 (two) times daily.  Dispense: 60 tablet; Refill: 3 - CMP14+EGFR - Lipid panel - TSH - T4, free - propranolol (INDERAL) 20 MG tablet; Take 1 tablet (20 mg total) by mouth 2 (two) times daily.  Dispense: 60 tablet; Refill: 3  2. Hypothyroidism due to acquired atrophy of thyroid Takes Levothyroxine sporadically No change to dose if TSH is abnormal - levothyroxine (SYNTHROID, LEVOTHROID) 50 MCG tablet; Take 1 tablet (50 mcg total) by mouth daily before breakfast.  Dispense: 60 tablet; Refill: 3  3. Essential  hypertension Uncontrolled Switched from HCTZ to beta blocker will will help with migraine prophylaxis Will watch for bradycardia as HR is on the low side of normal - propranolol (INDERAL) 20 MG tablet; Take 1 tablet (20 mg total) by mouth 2 (two) times daily.  Dispense: 60 tablet; Refill: 3  4. Chronic bilateral low back pain without sciatica Consider xray LS spine if uncontrolled at next OV - methocarbamol (ROBAXIN-750) 750 MG tablet; Take 1 tablet (750 mg total) by mouth 2 (two) times daily as needed for muscle spasms.  Dispense: 60 tablet; Refill: 3 - meloxicam (MOBIC) 7.5 MG tablet; Take 1 tablet (7.5 mg total) by mouth daily.  Dispense: 30 tablet; Refill: 1 - ketorolac (TORADOL) injection 60 mg; Inject 2 mLs (60 mg total) into the muscle once.   Meds ordered this encounter  Medications  . levothyroxine (SYNTHROID, LEVOTHROID) 50 MCG tablet    Sig: Take 1 tablet (50 mcg total) by mouth daily before breakfast.    Dispense:  60 tablet    Refill:  3  . topiramate (  TOPAMAX) 50 MG tablet    Sig: Take 1 tablet (50 mg total) by mouth 2 (two) times daily.    Dispense:  60 tablet    Refill:  3  . propranolol (INDERAL) 20 MG tablet    Sig: Take 1 tablet (20 mg total) by mouth 2 (two) times daily.    Dispense:  60 tablet    Refill:  3    Discontinue hydrochlorothiazide  . methocarbamol (ROBAXIN-750) 750 MG tablet    Sig: Take 1 tablet (750 mg total) by mouth 2 (two) times daily as needed for muscle spasms.    Dispense:  60 tablet    Refill:  3  . meloxicam (MOBIC) 7.5 MG tablet    Sig: Take 1 tablet (7.5 mg total) by mouth daily.    Dispense:  30 tablet    Refill:  1  . ketorolac (TORADOL) injection 60 mg    Follow-up: Return in about 1 month (around 07/29/2017) for Follow-up on migraine and low back pain.   Arnoldo Morale MD

## 2017-06-29 ENCOUNTER — Telehealth: Payer: Self-pay

## 2017-06-29 ENCOUNTER — Other Ambulatory Visit: Payer: Self-pay | Admitting: Family Medicine

## 2017-06-29 LAB — LIPID PANEL
CHOLESTEROL TOTAL: 264 mg/dL — AB (ref 100–199)
Chol/HDL Ratio: 3.1 ratio (ref 0.0–5.0)
HDL: 85 mg/dL (ref >39)
LDL CALC: 150 mg/dL — AB (ref 0–99)
Triglycerides: 146 mg/dL (ref 0–149)
VLDL CHOLESTEROL CAL: 29 mg/dL (ref 5–40)

## 2017-06-29 LAB — CMP14+EGFR
ALBUMIN: 4.7 g/dL (ref 3.5–5.5)
ALT: 22 IU/L (ref 0–44)
AST: 21 IU/L (ref 0–40)
Albumin/Globulin Ratio: 1.7 (ref 1.2–2.2)
Alkaline Phosphatase: 39 IU/L (ref 39–117)
BUN / CREAT RATIO: 9 (ref 9–20)
BUN: 9 mg/dL (ref 6–20)
Bilirubin Total: 0.9 mg/dL (ref 0.0–1.2)
CALCIUM: 9.8 mg/dL (ref 8.7–10.2)
CO2: 25 mmol/L (ref 20–29)
CREATININE: 0.95 mg/dL (ref 0.76–1.27)
Chloride: 99 mmol/L (ref 96–106)
GFR calc non Af Amer: 108 mL/min/{1.73_m2} (ref >59)
GFR, EST AFRICAN AMERICAN: 125 mL/min/{1.73_m2} (ref >59)
GLOBULIN, TOTAL: 2.8 g/dL (ref 1.5–4.5)
Glucose: 102 mg/dL — ABNORMAL HIGH (ref 65–99)
Potassium: 3.9 mmol/L (ref 3.5–5.2)
SODIUM: 139 mmol/L (ref 134–144)
TOTAL PROTEIN: 7.5 g/dL (ref 6.0–8.5)

## 2017-06-29 LAB — T4, FREE: FREE T4: 1.11 ng/dL (ref 0.82–1.77)

## 2017-06-29 LAB — TSH: TSH: 3.85 u[IU]/mL (ref 0.450–4.500)

## 2017-06-29 MED ORDER — ATORVASTATIN CALCIUM 20 MG PO TABS: 20.0000 mg | ORAL_TABLET | Freq: Every day | ORAL | 3 refills | Status: DC

## 2017-06-29 NOTE — Telephone Encounter (Signed)
Pt was called and informed of lab results. 

## 2017-07-04 MED FILL — ?ATORVASTATIN 20 MG TABLET: 20 | 30 days supply | Qty: 30 | Fill #0

## 2017-07-04 MED FILL — METHOCARBAMOL 750 MG TABS: 750 | 30 days supply | Qty: 60 | Fill #0

## 2017-07-04 MED FILL — PROPRANOLOL 20 MG TABLET: 20 | 30 days supply | Qty: 60 | Fill #0

## 2017-07-24 ENCOUNTER — Other Ambulatory Visit: Payer: Self-pay | Admitting: Internal Medicine

## 2017-08-06 ENCOUNTER — Ambulatory Visit: Payer: PRIVATE HEALTH INSURANCE | Admitting: Family Medicine

## 2017-08-16 ENCOUNTER — Ambulatory Visit: Payer: PRIVATE HEALTH INSURANCE | Admitting: Family Medicine

## 2017-08-27 MED FILL — ?ATORVASTATIN 20 MG TABLET: 20 | 30 days supply | Qty: 30 | Fill #1

## 2017-08-28 ENCOUNTER — Ambulatory Visit: Payer: PRIVATE HEALTH INSURANCE | Attending: Family Medicine | Admitting: Family Medicine

## 2017-08-28 ENCOUNTER — Encounter: Payer: Self-pay | Admitting: Family Medicine

## 2017-08-28 VITALS — BP 132/84 | HR 72 | Temp 97.8°F | Ht 72.0 in | Wt 210.6 lb

## 2017-08-28 DIAGNOSIS — E0500 Thyrotoxicosis with diffuse goiter without thyrotoxic crisis or storm: Secondary | ICD-10-CM | POA: Insufficient documentation

## 2017-08-28 DIAGNOSIS — M549 Dorsalgia, unspecified: Secondary | ICD-10-CM | POA: Diagnosis present

## 2017-08-28 DIAGNOSIS — Z111 Encounter for screening for respiratory tuberculosis: Secondary | ICD-10-CM

## 2017-08-28 DIAGNOSIS — Z79899 Other long term (current) drug therapy: Secondary | ICD-10-CM | POA: Diagnosis not present

## 2017-08-28 DIAGNOSIS — M545 Low back pain: Secondary | ICD-10-CM | POA: Insufficient documentation

## 2017-08-28 DIAGNOSIS — G8929 Other chronic pain: Secondary | ICD-10-CM | POA: Diagnosis not present

## 2017-08-28 DIAGNOSIS — J42 Unspecified chronic bronchitis: Secondary | ICD-10-CM | POA: Insufficient documentation

## 2017-08-28 DIAGNOSIS — E785 Hyperlipidemia, unspecified: Secondary | ICD-10-CM | POA: Diagnosis not present

## 2017-08-28 DIAGNOSIS — G43709 Chronic migraine without aura, not intractable, without status migrainosus: Secondary | ICD-10-CM | POA: Diagnosis not present

## 2017-08-28 DIAGNOSIS — I10 Essential (primary) hypertension: Secondary | ICD-10-CM | POA: Insufficient documentation

## 2017-08-28 MED ORDER — SUMATRIPTAN SUCCINATE 100 MG PO TABS: ORAL_TABLET | ORAL | 2 refills | Status: DC

## 2017-08-28 MED ORDER — TOPIRAMATE 100 MG PO TABS: 100.0000 mg | ORAL_TABLET | Freq: Two times a day (BID) | ORAL | 3 refills | Status: DC

## 2017-08-28 MED ORDER — PROPRANOLOL HCL 20 MG PO TABS: 20.0000 mg | ORAL_TABLET | Freq: Two times a day (BID) | ORAL | 3 refills | Status: DC

## 2017-08-28 MED ORDER — METHOCARBAMOL 500 MG PO TABS: 1000.0000 mg | ORAL_TABLET | Freq: Two times a day (BID) | ORAL | 3 refills | Status: DC | PRN

## 2017-08-28 MED FILL — ?PROPRANOLOL 20 MG TABLET: 20 | 30 days supply | Qty: 60 | Fill #0

## 2017-08-28 MED FILL — ?SUMATRIPTAN SUCC 100 MG TA: 100 | 30 days supply | Qty: 9 | Fill #0

## 2017-08-28 NOTE — Progress Notes (Signed)
Subjective:  Patient ID: Darren Barajas, male    DOB: 1986-08-29  Age: 31 y.o. MRN: [id number]  CC: Back Pain and Migraine   HPI Darren Barajas is a 31 year old male with a history of Hypertension, Hyperlipidemia, migraine and chronic low back pain here for a follow up visit.  He was switched from Hydrochlorothiazide to Propranolol for management of his Hypertension and his blood pressure is controlled today and he tolerates his medication.  For his migraine I had placed him on Topamax and encouraged up titration of his dose if uncontrolled however he complains migraines are still uncontrolled and he never increased his dose.  His low back pain improves with Robaxin but onset of action is too long he states. Back pain occurs when he does heavy lifting at work and is absent now.  Past Medical History:  Diagnosis Date  . Anginal pain (HCC)   . Chronic bronchitis (HCC)    "get it q couple months; q couple weeks" (04/07/2015)  . Graves' disease    treated w/radioactive ioding ablation in 2001  . Headache    "weekly" (04/07/2015)  . Hypertension    Not currently taking medications    Past Surgical History:  Procedure Laterality Date  . NO PAST SURGERIES      No Known Allergies   Outpatient Medications Prior to Visit  Medication Sig Dispense Refill  . levothyroxine (SYNTHROID, LEVOTHROID) 50 MCG tablet Take 1 tablet (50 mcg total) by mouth daily before breakfast. 60 tablet 3  . meloxicam (MOBIC) 7.5 MG tablet Take 1 tablet (7.5 mg total) by mouth daily. 30 tablet 1  . propranolol (INDERAL) 20 MG tablet Take 1 tablet (20 mg total) by mouth 2 (two) times daily. 60 tablet 3  . albuterol (PROVENTIL HFA) 108 (90 Base) MCG/ACT inhaler Inhale 2 puffs into the lungs every 6 (six) hours as needed for wheezing or shortness of breath. (Patient not taking: Reported on 06/28/2017) 1 Inhaler 1  . atorvastatin (LIPITOR) 20 MG tablet Take 1 tablet (20 mg total) by mouth daily. (Patient  not taking: Reported on 08/28/2017) 30 tablet 3  . Multiple Vitamins-Minerals (MULTIVITAMIN ADULT PO) Take 1 tablet by mouth daily.    . methocarbamol (ROBAXIN-750) 750 MG tablet Take 1 tablet (750 mg total) by mouth 2 (two) times daily as needed for muscle spasms. (Patient not taking: Reported on 08/28/2017) 60 tablet 3  . topiramate (TOPAMAX) 50 MG tablet Take 1 tablet (50 mg total) by mouth 2 (two) times daily. (Patient not taking: Reported on 08/28/2017) 60 tablet 3   No facility-administered medications prior to visit.     ROS Review of Systems  Constitutional: Negative for activity change and appetite change.  HENT: Negative for sinus pressure and sore throat.   Eyes: Negative for visual disturbance.  Respiratory: Negative for cough, chest tightness and shortness of breath.   Cardiovascular: Negative for chest pain and leg swelling.  Gastrointestinal: Negative for abdominal distention, abdominal pain, constipation and diarrhea.  Endocrine: Negative.   Genitourinary: Negative for dysuria.  Musculoskeletal: Positive for back pain. Negative for joint swelling and myalgias.  Skin: Negative for rash.  Allergic/Immunologic: Negative.   Neurological: Positive for headaches. Negative for weakness, light-headedness and numbness.  Psychiatric/Behavioral: Negative for dysphoric mood and suicidal ideas.    Objective:  BP 132/84   Pulse 72   Temp 97.8 F (36.6 C) (Oral)   Ht 6' (1.829 m)   Wt 210 lb 9.6 oz (95.5 kg)  SpO2 95%   BMI 28.56 kg/m   BP/Weight 08/28/2017 06/28/2017 04/15/2017  Systolic BP 132 147 157  Diastolic BP 84 95 105  Wt. (Lbs) 210.6 208 -  BMI 28.56 28.21 -  Some encounter information is confidential and restricted. Go to Review Flowsheets activity to see all data.      Physical Exam  Constitutional: He is oriented to person, place, and time. He appears well-developed and well-nourished.  Cardiovascular: Normal rate, normal heart sounds and intact distal  pulses.  No murmur heard. Pulmonary/Chest: Effort normal and breath sounds normal. He has no wheezes. He has no rales. He exhibits no tenderness.  Abdominal: Soft. Bowel sounds are normal. He exhibits no distension and no mass. There is no tenderness.  Musculoskeletal: Normal range of motion. He exhibits no edema.  Neurological: He is alert and oriented to person, place, and time.  Skin: Skin is warm and dry.  Psychiatric: He has a normal mood and affect.     Assessment & Plan:   1. Chronic bilateral low back pain without sciatica Improved but not fully optimized Increased dose of Robaxin Apply heart - methocarbamol (ROBAXIN) 500 MG tablet; Take 2 tablets (1,000 mg total) by mouth 2 (two) times daily as needed for muscle spasms.  Dispense: 120 tablet; Refill: 3  2. Chronic migraine without aura without status migrainosus, not intractable Uncontrolled Increased Topamax dose Imitrex added - topiramate (TOPAMAX) 100 MG tablet; Take 1 tablet (100 mg total) by mouth 2 (two) times daily.  Dispense: 60 tablet; Refill: 3 - SUMAtriptan (IMITREX) 100 MG tablet; Take 1 tab at the onset of a migraine, may repeat in 2 hours if headache persists or recurs.  Maximum total daily dose is 200 mg.  Dispense: 30 tablet; Refill: 2 - propranolol (INDERAL) 20 MG tablet; Take 1 tablet (20 mg total) by mouth 2 (two) times daily.  Dispense: 60 tablet; Refill: 3  3. Essential hypertension Improved Low sodium, DASH diet,lifestyle modification - propranolol (INDERAL) 20 MG tablet; Take 1 tablet (20 mg total) by mouth 2 (two) times daily.  Dispense: 60 tablet; Refill: 3   Meds ordered this encounter  Medications  . methocarbamol (ROBAXIN) 500 MG tablet    Sig: Take 2 tablets (1,000 mg total) by mouth 2 (two) times daily as needed for muscle spasms.    Dispense:  120 tablet    Refill:  3    Discontinue previous dose  . topiramate (TOPAMAX) 100 MG tablet    Sig: Take 1 tablet (100 mg total) by mouth 2 (two)  times daily.    Dispense:  60 tablet    Refill:  3    Discontinue previous dose  . SUMAtriptan (IMITREX) 100 MG tablet    Sig: Take 1 tab at the onset of a migraine, may repeat in 2 hours if headache persists or recurs.  Maximum total daily dose is 200 mg.    Dispense:  30 tablet    Refill:  2  . propranolol (INDERAL) 20 MG tablet    Sig: Take 1 tablet (20 mg total) by mouth 2 (two) times daily.    Dispense:  60 tablet    Refill:  3    Discontinue previous dose    Follow-up: Return in about 3 months (around 11/26/2017) for follow up of chronic medical conditions.   Jaclyn ShaggyEnobong Amao MD

## 2017-08-28 NOTE — Patient Instructions (Signed)

## 2017-08-28 NOTE — Progress Notes (Signed)
Pt states that medication is not working for migraines.

## 2017-08-30 LAB — TB SKIN TEST
INDURATION: 0.4 mm
TB SKIN TEST: NEGATIVE

## 2017-10-02 MED FILL — ?ATORVASTATIN 20MG TABLET: 20 | 30 days supply | Qty: 30 | Fill #2

## 2017-10-26 MED FILL — METHOCARBAMOL 500 MG TABLET: 500 | 30 days supply | Qty: 120 | Fill #0

## 2017-10-26 MED FILL — ?LEVOTHYROXINE 50 MCG TABLE: 50 | 30 days supply | Qty: 30 | Fill #0

## 2017-11-29 ENCOUNTER — Ambulatory Visit (HOSPITAL_COMMUNITY)
Admission: EM | Admit: 2017-11-29 | Discharge: 2017-11-29 | Disposition: A | Discharge status: Home / Self Care | Payer: Self-pay | Attending: Family Medicine | Admitting: Family Medicine

## 2017-11-29 ENCOUNTER — Ambulatory Visit: Payer: PRIVATE HEALTH INSURANCE | Admitting: Family Medicine

## 2017-11-29 ENCOUNTER — Encounter (HOSPITAL_COMMUNITY): Payer: Self-pay | Admitting: Family Medicine

## 2017-11-29 DIAGNOSIS — K529 Noninfective gastroenteritis and colitis, unspecified: Secondary | ICD-10-CM

## 2017-11-29 MED ORDER — ONDANSETRON 4 MG PO TBDP: 4.0000 mg | ORAL_TABLET | Freq: Three times a day (TID) | ORAL | 0 refills | Status: DC | PRN

## 2017-11-29 NOTE — ED Triage Notes (Signed)
Pt here for N,V,D since yesterday morning. sts that yesterday he took flu and cold meds. He was really weak this am and not able to get to work. sts daughter had flu and girlfriend.

## 2017-11-29 NOTE — Discharge Instructions (Signed)

## 2017-11-29 NOTE — ED Notes (Signed)
Patient discharged by provider.

## 2017-11-29 NOTE — ED Provider Notes (Signed)
[facility]   [id number] 11/29/17 Arrival Time: 1749  ASSESSMENT & PLAN:  1. Gastroenteritis     Meds ordered this encounter  Medications  . ondansetron (ZOFRAN-ODT) 4 MG disintegrating tablet    Sig: Take 1 tablet (4 mg total) by mouth every 8 (eight) hours as needed for nausea or vomiting.    Dispense:  12 tablet    Refill:  0   Discussed typical duration of symptoms for suspected viral GI illness. Will do his best to ensure adequate fluid intake in order to avoid dehydration. Will proceed to the Emergency Department for evaluation if unable to tolerate PO fluids regularly.  Otherwise he will f/u with his PCP or here if not showing improvement over the next 48-72 hours.  Reviewed expectations re: course of current medical issues. Questions answered. Outlined signs and symptoms indicating need for more acute intervention. Patient verbalized understanding. After Visit Summary given.   SUBJECTIVE: History from: patient.  Darren Barajas is a 32 y.o. male who presents with complaint of non-bloody intermittent nausea and vomiting of brown material with diarrhea. Onset abrupt, yesterday. Abdominal discomfort: mild and cramping. Symptoms are stable since beginning. Aggravating factors: eating. Alleviating factors: none. Associated symptoms: fatigue. He denies fever. Appetite: decreased. PO intake: decreased. Ambulatory without assistance. Urinary symptoms: none. Last bowel movement today without blood. OTC treatment: none.  No LMP for male patient.  Past Surgical History:  Procedure Laterality Date  . NO PAST SURGERIES      ROS: As per HPI.  OBJECTIVE:  Vitals:   11/29/17 1835  BP: (!) 152/92  Pulse: 78  Resp: 18  Temp: 98.4 F (36.9 C)  SpO2: 100%    General appearance: alert; no distress Oropharynx: moist Lungs: clear to auscultation bilaterally Heart: regular rate and rhythm Abdomen: soft; non-distended; no significant abdominal tenderness, "just  a cramping feeling"; bowel sounds present; no masses or organomegaly; no guarding or rebound tenderness Back: no CVA tenderness Extremities: no edema; symmetrical with no gross deformities Skin: warm and dry Neurologic: normal gait Psychological: alert and cooperative; normal mood and affect  No Known Allergies                                             Past Medical History:  Diagnosis Date  . Anginal pain (HCC)   . Chronic bronchitis (HCC)    "get it q couple months; q couple weeks" (04/07/2015)  . Graves' disease    treated w/radioactive ioding ablation in 2001  . Headache    "weekly" (04/07/2015)  . Hypertension    Not currently taking medications   Social History   Socioeconomic History  . Marital status: Single    Spouse name: Not on file  . Number of children: Not on file  . Years of education: Not on file  . Highest education level: Not on file  Occupational History  . Not on file  Social Needs  . Financial resource strain: Not on file  . Food insecurity:    Worry: Not on file    Inability: Not on file  . Transportation needs:    Medical: Not on file    Non-medical: Not on file  Tobacco Use  . Smoking status: Current Every Day Smoker    Packs/day: 1.00    Years: 10.00    Pack years: 10.00    Types: Cigarettes  .  Smokeless tobacco: Never Used  Substance and Sexual Activity  . Alcohol use: Yes    Alcohol/week: 21.6 oz    Types: 36 Cans of beer per week    Comment: 04/07/2015 "12 pack beer 2-3 days/wk"  . Drug use: Yes    Types: MDMA (Ecstacy), Cocaine, Marijuana    Comment: 04/07/2015 "last used MDMA, Xanax 04/06/2015; haven't used coke in the last couple months"  . Sexual activity: Yes  Lifestyle  . Physical activity:    Days per week: Not on file    Minutes per session: Not on file  . Stress: Not on file  Relationships  . Social connections:    Talks on phone: Not on file    Gets together: Not on file    Attends religious service: Not on file     Active member of club or organization: Not on file    Attends meetings of clubs or organizations: Not on file    Relationship status: Not on file  . Intimate partner violence:    Fear of current or ex partner: Not on file    Emotionally abused: Not on file    Physically abused: Not on file    Forced sexual activity: Not on file  Other Topics Concern  . Not on file  Social History Narrative   Works as CNA   Family History  Problem Relation Age of Onset  . Hyperthyroidism Mother   . Hyperthyroidism Maternal Guido Sander, MD 12/03/17 [phone]

## 2017-12-07 ENCOUNTER — Other Ambulatory Visit: Payer: Self-pay | Admitting: Family Medicine

## 2017-12-07 MED FILL — ATORVASTATIN 20 MG TABLET: 20 | 30 days supply | Qty: 30 | Fill #3

## 2017-12-07 MED FILL — PROPRANOLOL 20 MG TABLET: 20 | 30 days supply | Qty: 60 | Fill #1

## 2017-12-07 MED FILL — LEVOTHYROXINE 50 MCG TABLET: 50 | 30 days supply | Qty: 30 | Fill #1

## 2018-07-11 ENCOUNTER — Other Ambulatory Visit: Payer: Self-pay | Admitting: Family Medicine

## 2018-07-11 DIAGNOSIS — E034 Atrophy of thyroid (acquired): Secondary | ICD-10-CM

## 2018-07-11 MED FILL — METHOCARBAMOL 500 MG TABS: 500 | 30 days supply | Qty: 120 | Fill #1

## 2018-07-11 MED FILL — LEVOTHYROXINE 50 MCG TABLET: 50 | 30 days supply | Qty: 30 | Fill #0

## 2018-07-17 ENCOUNTER — Ambulatory Visit (HOSPITAL_COMMUNITY)
Admission: EM | Admit: 2018-07-17 | Discharge: 2018-07-17 | Disposition: A | Discharge status: Home / Self Care | Payer: PRIVATE HEALTH INSURANCE | Attending: Family Medicine | Admitting: Family Medicine

## 2018-07-17 ENCOUNTER — Encounter (HOSPITAL_COMMUNITY): Payer: Self-pay

## 2018-07-17 DIAGNOSIS — K219 Gastro-esophageal reflux disease without esophagitis: Secondary | ICD-10-CM

## 2018-07-17 DIAGNOSIS — I10 Essential (primary) hypertension: Secondary | ICD-10-CM

## 2018-07-17 DIAGNOSIS — R0789 Other chest pain: Secondary | ICD-10-CM

## 2018-07-17 LAB — POCT I-STAT, CHEM 8
BUN: 12 mg/dL (ref 6–20)
CALCIUM ION: 1.2 mmol/L (ref 1.15–1.40)
Chloride: 103 mmol/L (ref 98–111)
Creatinine, Ser: 0.9 mg/dL (ref 0.61–1.24)
Glucose, Bld: 80 mg/dL (ref 70–99)
HCT: 45 % (ref 39.0–52.0)
HEMOGLOBIN: 15.3 g/dL (ref 13.0–17.0)
POTASSIUM: 3.9 mmol/L (ref 3.5–5.1)
SODIUM: 138 mmol/L (ref 135–145)
TCO2: 26 mmol/L (ref 22–32)

## 2018-07-17 MED ORDER — OMEPRAZOLE 20 MG PO CPDR: 20.0000 mg | DELAYED_RELEASE_CAPSULE | Freq: Every day | ORAL | 0 refills | Status: DC

## 2018-07-17 NOTE — Discharge Instructions (Signed)
EKG and blood work not concerning for cardiac cause Please begin taking blood pressure and cholesterol medication as directed Follow up with PCP for further evaluation and management on chronic chest pain We will trial omeprazole given worsening symptoms while eating/ drinking Return or go to the ED if you have any new or worsening symptoms worsening chest pain or chest pain that changes, shortness of breath, nausea, vomiting, sweating, abdominal pain, changes in bowel or bladder habits, etc..Marland Kitchen

## 2018-07-17 NOTE — ED Provider Notes (Signed)
[facility]   [id number] 07/17/18 Arrival Time: 1408   CC: Multiple complaints (neck, back pain, migraine, and chest pain)  SUBJECTIVE:  Darren Barajas is a 32 y.o. male hx significant for HTN, HLD, and hypothyroid, who presents with complaint intermittent chest pain that began gradually a couple of months ago.  Denies a precipitating event, trauma, or recent lower respiratory tract.  States he stopped taking his blood pressure medication around the time of his symptoms.  Localizes chest pain to the substernal region.  Describes as stable that is intermittent and sharp in character.  Worse with eating, drinking and laughing too hard.  Has tried blood pressure medication without relief.  Denies radiating symptoms. Reports previous symptoms in the past and was treated with medications for HTN and HLD.  Complains of SOB and racing heart. Denies fever, chills, diaphoresis, palpitations, nausea, vomiting, abdominal pain, lightheadedness, dizziness, or changes in bowel or bladder habits, or radiating symptoms.    Denies recent long travel, hx of blood clots, or lower extremity surgery.    Does admit to tobacco use 1/2 PPD x 10-15 years   ROS: As per HPI. Past Medical History:  Diagnosis Date  . Anginal pain (HCC)   . Chronic bronchitis (HCC)    "get it q couple months; q couple weeks" (04/07/2015)  . Graves' disease    treated w/radioactive ioding ablation in 2001  . Headache    "weekly" (04/07/2015)  . Hypertension    Not currently taking medications   Past Surgical History:  Procedure Laterality Date  . NO PAST SURGERIES     No Known Allergies No current facility-administered medications on file prior to encounter.    Current Outpatient Medications on File Prior to Encounter  Medication Sig Dispense Refill  . albuterol (PROVENTIL HFA) 108 (90 Base) MCG/ACT inhaler Inhale 2 puffs into the lungs every 6 (six) hours as needed for wheezing or shortness of breath. (Patient  not taking: Reported on 06/28/2017) 1 Inhaler 1  . atorvastatin (LIPITOR) 20 MG tablet Take 1 tablet (20 mg total) by mouth daily. MUST MAKE APPT FOR FURTHER REFILLS 30 tablet 0  . levothyroxine (SYNTHROID, LEVOTHROID) 50 MCG tablet Take 1 tablet (50 mcg total) by mouth daily before breakfast. MUST MAKE APPT FOR FURTHER REFILLS 30 tablet 0  . Multiple Vitamins-Minerals (MULTIVITAMIN ADULT PO) Take 1 tablet by mouth daily.    . ondansetron (ZOFRAN-ODT) 4 MG disintegrating tablet Take 1 tablet (4 mg total) by mouth every 8 (eight) hours as needed for nausea or vomiting. 12 tablet 0  . propranolol (INDERAL) 20 MG tablet Take 1 tablet (20 mg total) by mouth 2 (two) times daily. 60 tablet 3  . SUMAtriptan (IMITREX) 100 MG tablet Take 1 tab at the onset of a migraine, may repeat in 2 hours if headache persists or recurs.  Maximum total daily dose is 200 mg. 30 tablet 2  . topiramate (TOPAMAX) 100 MG tablet Take 1 tablet (100 mg total) by mouth 2 (two) times daily. 60 tablet 3   Social History   Socioeconomic History  . Marital status: Single    Spouse name: Not on file  . Number of children: Not on file  . Years of education: Not on file  . Highest education level: Not on file  Occupational History  . Not on file  Social Needs  . Financial resource strain: Not on file  . Food insecurity:    Worry: Not on file    Inability: Not  on file  . Transportation needs:    Medical: Not on file    Non-medical: Not on file  Tobacco Use  . Smoking status: Current Every Day Smoker    Packs/day: 1.00    Years: 10.00    Pack years: 10.00    Types: Cigarettes  . Smokeless tobacco: Never Used  Substance and Sexual Activity  . Alcohol use: Yes    Alcohol/week: 36.0 standard drinks    Types: 36 Cans of beer per week    Comment: 04/07/2015 "12 pack beer 2-3 days/wk"  . Drug use: Yes    Types: MDMA (Ecstacy), Cocaine, Marijuana    Comment: 04/07/2015 "last used MDMA, Xanax 04/06/2015; haven't used coke in  the last couple months"  . Sexual activity: Yes  Lifestyle  . Physical activity:    Days per week: Not on file    Minutes per session: Not on file  . Stress: Not on file  Relationships  . Social connections:    Talks on phone: Not on file    Gets together: Not on file    Attends religious service: Not on file    Active member of club or organization: Not on file    Attends meetings of clubs or organizations: Not on file    Relationship status: Not on file  . Intimate partner violence:    Fear of current or ex partner: Not on file    Emotionally abused: Not on file    Physically abused: Not on file    Forced sexual activity: Not on file  Other Topics Concern  . Not on file  Social History Narrative   Works as CNA   Family History  Problem Relation Age of Onset  . Hyperthyroidism Mother   . Hyperthyroidism Maternal Uncle      OBJECTIVE:  Vitals:   07/17/18 1542  BP: (!) 158/100  Pulse: 73  Resp: 20  Temp: 97.6 F (36.4 C)  TempSrc: Oral  SpO2: 96%     General appearance: alert; no distress Eyes: PERRLA; EOMI; conjunctiva normal HENT: normocephalic; atraumatic Neck: supple Lungs: clear to auscultation bilaterally without adventitious breath sounds Heart: regular rate and rhythm.  Clear S1 and S2 without rubs, gallops, or murmur. Chest Wall: nontender to palpation, unable to reproduce symptoms on exam Abdomen: soft, mild diffuse epigastric tenderness; bowel sounds normal; no guarding or rebound tenderness Extremities: no cyanosis or edema; symmetrical with no gross deformities Skin: warm and dry Psychological: alert and cooperative; normal mood and affect  ECG:  EKG normal sinus rhythm without ST elevations, depressions, or prolonged PR interval.  No narrowing or widening of the QRS complexes.  Reviewed EKG with Dr. Tracie Harrier.  Comparable to past EKGs    Orders placed or performed during the hospital encounter of 07/17/18  . ED EKG  . ED EKG   Results for  orders placed or performed during the hospital encounter of 07/17/18 (from the past 24 hour(s))  I-STAT, chem 8     Status: None   Collection Time: 07/17/18  4:40 PM  Result Value Ref Range   Sodium 138 135 - 145 mmol/L   Potassium 3.9 3.5 - 5.1 mmol/L   Chloride 103 98 - 111 mmol/L   BUN 12 6 - 20 mg/dL   Creatinine, Ser 1.61 0.61 - 1.24 mg/dL   Glucose, Bld 80 70 - 99 mg/dL   Calcium, Ion 0.96 1.15 - 1.40 mmol/L   TCO2 26 22 - 32 mmol/L   Hemoglobin 15.3  13.0 - 17.0 g/dL   HCT 16.1 09.6 - 04.5 %   ASSESSMENT & PLAN:  1. Other chest pain   2. Essential hypertension   3. Gastroesophageal reflux disease, esophagitis presence not specified    Discussed patient case with Dr. Tracie Harrier.  Low suspicion for cardiac cause given young age and no family hx of cardiac events at young age.  Hypertensive.  PE WNL.  Negative EKG and blood work.  Recommend close follow up with PCP for further evaluation and management of chronic chest pain.  Strict return and ED precautions given.    Meds ordered this encounter  Medications  . omeprazole (PRILOSEC) 20 MG capsule    Sig: Take 1 capsule (20 mg total) by mouth daily.    Dispense:  20 capsule    Refill:  0    Order Specific Question:   Supervising Provider    Answer:   Isa Rankin [[id number]]   EKG and blood work not concerning for cardiac cause Please begin taking blood pressure and cholesterol medication as directed Follow up with PCP for further evaluation and management on chronic chest pain We will trial omeprazole given worsening symptoms while eating/ drinking Return or go to the ED if you have any new or worsening symptoms worsening chest pain or chest pain that changes, shortness of breath, nausea, vomiting, sweating, abdominal pain, changes in bowel or bladder habits, etc...  Chest pain precautions given. Reviewed expectations re: course of current medical issues. Questions answered. Outlined signs and symptoms indicating need for  more acute intervention. Patient verbalized understanding. After Visit Summary given.   Rennis Harding, PA-C 07/17/18 1929

## 2018-07-17 NOTE — ED Triage Notes (Signed)
Pt presents with back pain and neck pain.

## 2018-07-23 ENCOUNTER — Ambulatory Visit: Payer: Self-pay | Attending: Critical Care Medicine | Admitting: Critical Care Medicine

## 2018-07-23 ENCOUNTER — Other Ambulatory Visit: Payer: Self-pay

## 2018-07-23 ENCOUNTER — Encounter: Payer: Self-pay | Admitting: Critical Care Medicine

## 2018-07-23 VITALS — BP 190/100 | HR 72 | Temp 98.5°F | Resp 12 | Ht 72.0 in | Wt 225.0 lb

## 2018-07-23 DIAGNOSIS — I160 Hypertensive urgency: Secondary | ICD-10-CM | POA: Insufficient documentation

## 2018-07-23 DIAGNOSIS — Z720 Tobacco use: Secondary | ICD-10-CM

## 2018-07-23 DIAGNOSIS — I10 Essential (primary) hypertension: Secondary | ICD-10-CM | POA: Insufficient documentation

## 2018-07-23 DIAGNOSIS — Z79899 Other long term (current) drug therapy: Secondary | ICD-10-CM | POA: Insufficient documentation

## 2018-07-23 DIAGNOSIS — E0500 Thyrotoxicosis with diffuse goiter without thyrotoxic crisis or storm: Secondary | ICD-10-CM | POA: Insufficient documentation

## 2018-07-23 DIAGNOSIS — Z760 Encounter for issue of repeat prescription: Secondary | ICD-10-CM | POA: Insufficient documentation

## 2018-07-23 DIAGNOSIS — Z789 Other specified health status: Secondary | ICD-10-CM

## 2018-07-23 DIAGNOSIS — Z7289 Other problems related to lifestyle: Secondary | ICD-10-CM | POA: Insufficient documentation

## 2018-07-23 DIAGNOSIS — F17210 Nicotine dependence, cigarettes, uncomplicated: Secondary | ICD-10-CM | POA: Insufficient documentation

## 2018-07-23 DIAGNOSIS — N179 Acute kidney failure, unspecified: Secondary | ICD-10-CM | POA: Insufficient documentation

## 2018-07-23 MED ORDER — AMLODIPINE BESYLATE 10 MG PO TABS: 10.0000 mg | ORAL_TABLET | Freq: Every day | ORAL | 3 refills | Status: DC

## 2018-07-23 MED ORDER — HYDROCHLOROTHIAZIDE 25 MG PO TABS: 25.0000 mg | ORAL_TABLET | Freq: Every day | ORAL | 6 refills | Status: DC

## 2018-07-23 MED ORDER — CLONIDINE HCL 0.2 MG PO TABS
0.2000 mg | ORAL_TABLET | Freq: Once | ORAL | Status: AC
  Administered 2018-07-23: 0.2 mg via ORAL

## 2018-07-23 MED ORDER — CLONIDINE HCL 0.1 MG PO TABS: 0.1000 mg | ORAL_TABLET | Freq: Two times a day (BID) | ORAL | 3 refills | Status: DC

## 2018-07-23 MED FILL — cloNIDine HCL 0.1 MG TABS: 0.1 | 30 days supply | Qty: 60 | Fill #0

## 2018-07-23 MED FILL — AMLODIPINE BESYLATE 10 MG T: 10 | 30 days supply | Qty: 30 | Fill #0

## 2018-07-23 MED FILL — HYDROCHLOROTHIAZIDE 25 MG T: 25 | 30 days supply | Qty: 30 | Fill #0

## 2018-07-23 NOTE — Patient Instructions (Addendum)
Stop Propanolol Start amlodipine 10mg  daily Start clonidine 0.1mg  twice daily Start HCTZ 25mg  daily Follow low salt diet Stay off work for 24hours Return one week recheck with Clinical pharmacist Franky MachoLuke for BP check See Dr Alvis LemmingsNewlin in one month Hypertension Hypertension is another name for high blood pressure. High blood pressure forces your heart to work harder to pump blood. This can cause problems over time. There are two numbers in a blood pressure reading. There is a top number (systolic) over a bottom number (diastolic). It is best to have a blood pressure below 120/80. Healthy choices can help lower your blood pressure. You may need medicine to help lower your blood pressure if:  Your blood pressure cannot be lowered with healthy choices.  Your blood pressure is higher than 130/80.  Follow these instructions at home: Eating and drinking  If directed, follow the DASH eating plan. This diet includes: ? Filling half of your plate at each meal with fruits and vegetables. ? Filling one quarter of your plate at each meal with whole grains. Whole grains include whole wheat pasta, brown rice, and whole grain bread. ? Eating or drinking low-fat dairy products, such as skim milk or low-fat yogurt. ? Filling one quarter of your plate at each meal with low-fat (lean) proteins. Low-fat proteins include fish, skinless chicken, eggs, beans, and tofu. ? Avoiding fatty meat, cured and processed meat, or chicken with skin. ? Avoiding premade or processed food.  Eat less than 1,500 mg of salt (sodium) a day.  Limit alcohol use to no more than 1 drink a day for nonpregnant women and 2 drinks a day for men. One drink equals 12 oz of beer, 5 oz of wine, or 1 oz of hard liquor. Lifestyle  Work with your doctor to stay at a healthy weight or to lose weight. Ask your doctor what the best weight is for you.  Get at least 30 minutes of exercise that causes your heart to beat faster (aerobic exercise) most  days of the week. This may include walking, swimming, or biking.  Get at least 30 minutes of exercise that strengthens your muscles (resistance exercise) at least 3 days a week. This may include lifting weights or pilates.  Do not use any products that contain nicotine or tobacco. This includes cigarettes and e-cigarettes. If you need help quitting, ask your doctor.  Check your blood pressure at home as told by your doctor.  Keep all follow-up visits as told by your doctor. This is important. Medicines  Take over-the-counter and prescription medicines only as told by your doctor. Follow directions carefully.  Do not skip doses of blood pressure medicine. The medicine does not work as well if you skip doses. Skipping doses also puts you at risk for problems.  Ask your doctor about side effects or reactions to medicines that you should watch for. Contact a doctor if:  You think you are having a reaction to the medicine you are taking.  You have headaches that keep coming back (recurring).  You feel dizzy.  You have swelling in your ankles.  You have trouble with your vision. Get help right away if:  You get a very bad headache.  You start to feel confused.  You feel weak or numb.  You feel faint.  You get very bad pain in your: ? Chest. ? Belly (abdomen).  You throw up (vomit) more than once.  You have trouble breathing. Summary  Hypertension is another name for high blood  pressure.  Making healthy choices can help lower blood pressure. If your blood pressure cannot be controlled with healthy choices, you may need to take medicine. This information is not intended to replace advice given to you by your health care provider. Make sure you discuss any questions you have with your health care provider. Document Released: 02/14/2008 Document Revised: 07/26/2016 Document Reviewed: 07/26/2016 Elsevier Interactive Patient Education  [employer].

## 2018-07-23 NOTE — Progress Notes (Signed)
Subjective:    Patient ID: Darren Barajas, male    DOB: March 05, 1986, 32 y.o.   MRN: [id number]  32 y.o.M here for med refill for HTN.  Has not been using meds regularly  Hypertension  This is a chronic problem. The current episode started more than 1 year ago. The problem has been rapidly worsening since onset. Associated symptoms include anxiety, chest pain and headaches. Pertinent negatives include no blurred vision, palpitations, peripheral edema or shortness of breath. Risk factors for coronary artery disease include smoking/tobacco exposure, family history and male gender. Compliance problems include medication side effects.  There is no history of kidney disease, CAD/MI, CVA or heart failure.   Past Medical History:  Diagnosis Date  . Anginal pain (HCC)   . Chronic bronchitis (HCC)    "get it q couple months; q couple weeks" (04/07/2015)  . Graves' disease    treated w/radioactive ioding ablation in 2001  . Headache    "weekly" (04/07/2015)  . Hypertension    Not currently taking medications     Family History  Problem Relation Age of Onset  . Hyperthyroidism Mother   . Hyperthyroidism Maternal Uncle      Social History   Socioeconomic History  . Marital status: Single    Spouse name: Not on file  . Number of children: Not on file  . Years of education: Not on file  . Highest education level: Not on file  Occupational History  . Not on file  Social Needs  . Financial resource strain: Not on file  . Food insecurity:    Worry: Not on file    Inability: Not on file  . Transportation needs:    Medical: Not on file    Non-medical: Not on file  Tobacco Use  . Smoking status: Current Every Day Smoker    Packs/day: 1.00    Years: 10.00    Pack years: 10.00    Types: Cigarettes  . Smokeless tobacco: Never Used  Substance and Sexual Activity  . Alcohol use: Yes    Alcohol/week: 36.0 standard drinks    Types: 36 Cans of beer per week    Comment: 04/07/2015 "12  pack beer 2-3 days/wk"  . Drug use: Yes    Types: MDMA (Ecstacy), Cocaine, Marijuana    Comment: 04/07/2015 "last used MDMA, Xanax 04/06/2015; haven't used coke in the last couple months"  . Sexual activity: Yes  Lifestyle  . Physical activity:    Days per week: Not on file    Minutes per session: Not on file  . Stress: Not on file  Relationships  . Social connections:    Talks on phone: Not on file    Gets together: Not on file    Attends religious service: Not on file    Active member of club or organization: Not on file    Attends meetings of clubs or organizations: Not on file    Relationship status: Not on file  . Intimate partner violence:    Fear of current or ex partner: Not on file    Emotionally abused: Not on file    Physically abused: Not on file    Forced sexual activity: Not on file  Other Topics Concern  . Not on file  Social History Narrative   Works as CNA     No Known Allergies   Outpatient Medications Prior to Visit  Medication Sig Dispense Refill  . levothyroxine (SYNTHROID, LEVOTHROID) 50 MCG tablet Take 1 tablet (  50 mcg total) by mouth daily before breakfast. MUST MAKE APPT FOR FURTHER REFILLS 30 tablet 0  . propranolol (INDERAL) 20 MG tablet Take 1 tablet (20 mg total) by mouth 2 (two) times daily. 60 tablet 3  . albuterol (PROVENTIL HFA) 108 (90 Base) MCG/ACT inhaler Inhale 2 puffs into the lungs every 6 (six) hours as needed for wheezing or shortness of breath. (Patient not taking: Reported on 06/28/2017) 1 Inhaler 1  . atorvastatin (LIPITOR) 20 MG tablet Take 1 tablet (20 mg total) by mouth daily. MUST MAKE APPT FOR FURTHER REFILLS (Patient not taking: Reported on 07/23/2018) 30 tablet 0  . methocarbamol (ROBAXIN) 500 MG tablet Take 1 tablet by mouth 2 (two) times daily as needed.  3  . Multiple Vitamins-Minerals (MULTIVITAMIN ADULT PO) Take 1 tablet by mouth daily.    Marland Kitchen omeprazole (PRILOSEC) 20 MG capsule Take 1 capsule (20 mg total) by mouth daily.  (Patient not taking: Reported on 07/23/2018) 20 capsule 0  . ondansetron (ZOFRAN-ODT) 4 MG disintegrating tablet Take 1 tablet (4 mg total) by mouth every 8 (eight) hours as needed for nausea or vomiting. (Patient not taking: Reported on 07/23/2018) 12 tablet 0  . SUMAtriptan (IMITREX) 100 MG tablet Take 1 tab at the onset of a migraine, may repeat in 2 hours if headache persists or recurs.  Maximum total daily dose is 200 mg. (Patient not taking: Reported on 07/23/2018) 30 tablet 2  . topiramate (TOPAMAX) 100 MG tablet Take 1 tablet (100 mg total) by mouth 2 (two) times daily. (Patient not taking: Reported on 07/23/2018) 60 tablet 3   No facility-administered medications prior to visit.       Review of Systems  Eyes: Negative for blurred vision.  Respiratory: Negative for shortness of breath.   Cardiovascular: Positive for chest pain. Negative for palpitations.  Neurological: Positive for headaches.       Objective:   Physical Exam Vitals:   07/23/18 1613 07/23/18 1640 07/23/18 1700  BP: (!) 195/133 (!) 220/150 (!) 190/100  Pulse: 72    Resp: 12    Temp: 98.5 F (36.9 C)    TempSrc: Oral    SpO2: 97%    Weight: 225 lb (102.1 kg)    Height: 6' (1.829 m)      Gen: Pleasant, well-nourished, in no distress,  normal affect  ENT: No lesions,  mouth clear,  oropharynx clear, no postnasal drip, no papilledema seen in the retina bilaterally  Neck: No JVD, no TMG, no carotid bruits  Lungs: No use of accessory muscles, no dullness to percussion, clear without rales or rhonchi  Cardiovascular: RRR, heart sounds normal, no murmur or gallops, no peripheral edema  Abdomen: soft and NT, no HSM,  BS normal  Musculoskeletal: No deformities, no cyanosis or clubbing  Neuro: alert, non focal  Skin: Warm, no lesions or rashes  No results found.   BMP Latest Ref Rng & Units 07/17/2018 06/28/2017 04/15/2017  Glucose 70 - 99 mg/dL 80 161(W) 960(A)  BUN 6 - 20 mg/dL 12 9 14   Creatinine  0.61 - 1.24 mg/dL 5.40 9.81 1.91  BUN/Creat Ratio 9 - 20 - 9 -  Sodium 135 - 145 mmol/L 138 139 137  Potassium 3.5 - 5.1 mmol/L 3.9 3.9 3.5  Chloride 98 - 111 mmol/L 103 99 103  CO2 20 - 29 mmol/L - 25 24  Calcium 8.7 - 10.2 mg/dL - 9.8 9.5   CXR NAD from 2018 reviewed     Assessment &  Plan:  I personally reviewed all images and lab data in [facility] as well as any outside material available during this office visit and agree with the  radiology impressions.   Hypertensive urgency Hypertensive urgency without evidence of malignant hypertension There was no evidence of papilledema or other significant endorgan damage at this visit Blood pressure initially was 220/130 and after 0.2 mg of clonidine it was reduced to 190/100.  Plan Discontinue propranolol due to side effects Begin clonidine 0.1 mg twice daily Begin amlodipine 10 mg daily Begin hydrochlorothiazide 25 mg daily Hypertensive diet instruction was given to the patient Counseling regarding stress reduction was given to the patient Work release to be off work for 24 hours was given to the patient Return visit in 1 week with clinical pharmacist evaluation and recheck for blood pressure If the patient has worsening symptoms of headache or other acute changes he was instructed to return to the emergency room  Acute renal failure syndrome (HCC) He has history of acute renal failure however note creatinine was normal on 07/17/2018 in the emergency room Plan Continue to monitor renal function  Tobacco use The patient was given instructions as to smoking cessation counseling  Alcohol use Patient drinks about a sixpack of beer a day and was given recommendations to reduce alcohol consumption to less than 2 beers per day   Luisa Hartatrick was seen today for medication refill.  Diagnoses and all orders for this visit:  Hypertensive urgency  Essential hypertension -     cloNIDine (CATAPRES) tablet 0.2 mg  Acute renal failure,  unspecified acute renal failure type (HCC)  Tobacco use  Alcohol use  Other orders -     hydrochlorothiazide (HYDRODIURIL) 25 MG tablet; Take 1 tablet (25 mg total) by mouth daily. -     amLODipine (NORVASC) 10 MG tablet; Take 1 tablet (10 mg total) by mouth daily. -     cloNIDine (CATAPRES) 0.1 MG tablet; Take 1 tablet (0.1 mg total) by mouth 2 (two) times daily.    I had an extended discussion with the patient and or family lasting 10  minutes of a 25 minute visit including: Need to pursue a less stressful lifestyle, importance of adherence to blood pressure medications, dietary changes relative to hypertension including the use of a DASH diet

## 2018-07-23 NOTE — Progress Notes (Signed)
Has not taken medication because he felt abdominal discomfort.  States he is only taking. Thyroid and blood pressure medication off BP medication x 1 mth  Needs refill on blood pressure and cholesterol medication

## 2018-07-24 ENCOUNTER — Encounter: Payer: Self-pay | Admitting: Critical Care Medicine

## 2018-07-24 DIAGNOSIS — Z7289 Other problems related to lifestyle: Secondary | ICD-10-CM | POA: Insufficient documentation

## 2018-07-24 DIAGNOSIS — Z720 Tobacco use: Secondary | ICD-10-CM | POA: Insufficient documentation

## 2018-07-24 DIAGNOSIS — I160 Hypertensive urgency: Secondary | ICD-10-CM | POA: Insufficient documentation

## 2018-07-24 DIAGNOSIS — Z789 Other specified health status: Secondary | ICD-10-CM | POA: Insufficient documentation

## 2018-07-24 NOTE — Assessment & Plan Note (Signed)
He has history of acute renal failure however note creatinine was normal on 07/17/2018 in the emergency room Plan Continue to monitor renal function

## 2018-07-24 NOTE — Assessment & Plan Note (Signed)
Patient drinks about a sixpack of beer a day and was given recommendations to reduce alcohol consumption to less than 2 beers per day

## 2018-07-24 NOTE — Assessment & Plan Note (Signed)
Hypertensive urgency without evidence of malignant hypertension There was no evidence of papilledema or other significant endorgan damage at this visit Blood pressure initially was 220/130 and after 0.2 mg of clonidine it was reduced to 190/100.  Plan Discontinue propranolol due to side effects Begin clonidine 0.1 mg twice daily Begin amlodipine 10 mg daily Begin hydrochlorothiazide 25 mg daily Hypertensive diet instruction was given to the patient Counseling regarding stress reduction was given to the patient Work release to be off work for 24 hours was given to the patient Return visit in 1 week with clinical pharmacist evaluation and recheck for blood pressure If the patient has worsening symptoms of headache or other acute changes he was instructed to return to the emergency room

## 2018-07-24 NOTE — Assessment & Plan Note (Signed)
The patient was given instructions as to smoking cessation counseling

## 2018-07-30 ENCOUNTER — Encounter: Payer: Self-pay | Admitting: *Deleted

## 2018-08-01 ENCOUNTER — Ambulatory Visit: Payer: PRIVATE HEALTH INSURANCE | Attending: Family Medicine | Admitting: Pharmacist

## 2018-08-01 ENCOUNTER — Encounter: Payer: Self-pay | Admitting: Pharmacist

## 2018-08-01 VITALS — BP 149/95 | HR 78

## 2018-08-01 DIAGNOSIS — I10 Essential (primary) hypertension: Secondary | ICD-10-CM | POA: Insufficient documentation

## 2018-08-01 DIAGNOSIS — F17210 Nicotine dependence, cigarettes, uncomplicated: Secondary | ICD-10-CM | POA: Insufficient documentation

## 2018-08-01 DIAGNOSIS — Z79899 Other long term (current) drug therapy: Secondary | ICD-10-CM | POA: Insufficient documentation

## 2018-08-01 NOTE — Patient Instructions (Signed)
Thank you for coming to see us today.   Blood pressure today is improving.   Take 1 pill of clonidine in the morning. (little pill) Take 1 pill of HCTZ (fluid pill) in the morning. (orange pill)  Take 1 pill of clonidine in the evening. (little pill) Take 1 pill of amlodipine in the evening. (white, round pill)  Limiting salt and caffeine, as well as exercising as able for at least 30 minutes for 5 days out of the week, can also help you lower your blood pressure.  Take your blood pressure at home if you are able. Please write down these numbers and bring them to your visits.  If you have any questions about medications, please call me [phone].  Darren MachoLuke

## 2018-08-01 NOTE — Progress Notes (Signed)
   S:    PCP: Dr. Alvis LemmingsNewlin  Patient arrives in good spirits. Presents to the clinic for hypertension management. Patient was referred by Dr. Delford FieldWright on 07/23/18. BP elevated at that visit. Required clonidine. BP decreased to 190/100. Dr. Delford FieldWright started amlodipine, HCTZ, and clonidine. Of note, pt reported being without HTN medications for a month previous to that visit.   Denies chest pain, shortness of breath, and blurred vision. Reports improvement in headaches.   Patient reports adherence with medications.  Current BP Medications include:  Amlodipine 10 mg daily, clonidine 0.1 mg BID, and HCTZ 25 mg daily  Antihypertensives tried in the past include: propranolol 20 mg BID  Dietary habits include: does not limit salt, denies drinking caffeine Exercise habits include: gets most of his exercise through work Family / Social history: smokes 8-9 tobacco, drinks 1-2 beers  Home BP readings: denies checking at home   O:  L arm after 5 minutes rest: 149/95, HR 78  Last 3 Office BP readings: BP Readings from Last 3 Encounters:  07/23/18 (!) 190/100  07/17/18 (!) 158/100  11/29/17 (!) 152/92   BMET    Component Value Date/Time   NA 138 07/17/2018 1640   NA 139 06/28/2017 0953   K 3.9 07/17/2018 1640   CL 103 07/17/2018 1640   CO2 25 06/28/2017 0953   GLUCOSE 80 07/17/2018 1640   BUN 12 07/17/2018 1640   BUN 9 06/28/2017 0953   CREATININE 0.90 07/17/2018 1640   CALCIUM 9.8 06/28/2017 0953   GFRNONAA 108 06/28/2017 0953   GFRAA 125 06/28/2017 0953    Renal function: Estimated Creatinine Clearance: 148.4 mL/min (by C-G formula based on SCr of 0.9 mg/dL).  A/P: Hypertension longstanding currently uncontrolled but much improved on current medications. BP Goal <130/80 mmHg. Patient is adherent with current medications. Will continue current regimen as patient has only re-started medication for a little over a week.  -Continued anti-hypertensives.  -Counseled on lifestyle  modifications for blood pressure control including reduced dietary sodium, increased exercise, adequate sleep  Results reviewed and written information provided. Total time in face-to-face counseling 15 minutes.   F/U Clinic Visit w. PCP.   Butch PennyLuke Van Ausdall, PharmD, CPP Clinical Pharmacist [facility] [phone]

## 2018-09-09 ENCOUNTER — Ambulatory Visit: Payer: PRIVATE HEALTH INSURANCE | Attending: Family Medicine | Admitting: Family Medicine

## 2018-09-09 ENCOUNTER — Encounter: Payer: Self-pay | Admitting: Family Medicine

## 2018-09-09 VITALS — BP 150/110 | HR 71 | Temp 97.8°F | Ht 72.0 in | Wt 236.0 lb

## 2018-09-09 DIAGNOSIS — G43709 Chronic migraine without aura, not intractable, without status migrainosus: Secondary | ICD-10-CM | POA: Diagnosis not present

## 2018-09-09 DIAGNOSIS — E785 Hyperlipidemia, unspecified: Secondary | ICD-10-CM

## 2018-09-09 DIAGNOSIS — R635 Abnormal weight gain: Secondary | ICD-10-CM | POA: Diagnosis not present

## 2018-09-09 DIAGNOSIS — Z9114 Patient's other noncompliance with medication regimen: Secondary | ICD-10-CM

## 2018-09-09 DIAGNOSIS — M545 Low back pain: Secondary | ICD-10-CM | POA: Insufficient documentation

## 2018-09-09 DIAGNOSIS — E034 Atrophy of thyroid (acquired): Secondary | ICD-10-CM | POA: Insufficient documentation

## 2018-09-09 DIAGNOSIS — Z23 Encounter for immunization: Secondary | ICD-10-CM | POA: Insufficient documentation

## 2018-09-09 DIAGNOSIS — I10 Essential (primary) hypertension: Secondary | ICD-10-CM | POA: Diagnosis not present

## 2018-09-09 DIAGNOSIS — G8929 Other chronic pain: Secondary | ICD-10-CM | POA: Insufficient documentation

## 2018-09-09 DIAGNOSIS — Z79899 Other long term (current) drug therapy: Secondary | ICD-10-CM | POA: Insufficient documentation

## 2018-09-09 DIAGNOSIS — Z79890 Hormone replacement therapy (postmenopausal): Secondary | ICD-10-CM | POA: Insufficient documentation

## 2018-09-09 MED ORDER — LOSARTAN POTASSIUM-HCTZ 100-25 MG PO TABS: 1.0000 | ORAL_TABLET | Freq: Every day | ORAL | 6 refills | Status: DC

## 2018-09-09 MED ORDER — TOPIRAMATE 100 MG PO TABS: 100.0000 mg | ORAL_TABLET | Freq: Two times a day (BID) | ORAL | 6 refills | Status: DC

## 2018-09-09 MED ORDER — SUMATRIPTAN SUCCINATE 100 MG PO TABS: ORAL_TABLET | ORAL | 2 refills | Status: DC

## 2018-09-09 MED ORDER — IBUPROFEN 600 MG PO TABS: 600.0000 mg | ORAL_TABLET | Freq: Three times a day (TID) | ORAL | 1 refills | Status: DC | PRN

## 2018-09-09 NOTE — Patient Instructions (Signed)

## 2018-09-09 NOTE — Progress Notes (Addendum)
Subjective:  Patient ID: Darren Barajas, male    DOB: 1986/07/31  Age: 32 y.o. MRN: [id number]  CC: Hypertension   HPI Darren Barajas  is a 32 year old male with a history of Hypertension, Hyperlipidemia, migraine and chronic low back pain here for a follow up visit. Last month he was seen by Dr. Delford Field with severely elevated blood pressures for which he was commenced on amlodipine and clonidine and he had a follow-up visit with the clinical pharmacist with resulting improvement in his blood pressure. Today his blood pressure is still elevated and he has been taking his antihypertensives intermittently because he does not want it "to mess with him" since he drinks alcohol. His migraines have been controlled on Topamax and Imitrex and he is unable to recall his last migraine episode. Noncompliant with his statin for hyperlipidemia. He has noticed an 11 pound weight gain and is wondering how this occurred as he does not think he has been overeating. Currently on levothyroxine and his last TSH from 06/2017 was normal. He does have chronic low back pain and neck pain which is absent at this time and is requesting a refill on ibuprofen.  Pain in his back and neck do not radiate to his lower or upper extremities.  Past Medical History:  Diagnosis Date  . Anginal pain (HCC)   . Chronic bronchitis (HCC)    "get it q couple months; q couple weeks" (04/07/2015)  . Graves' disease    treated w/radioactive ioding ablation in 2001  . Headache    "weekly" (04/07/2015)  . Hypertension    Not currently taking medications    Past Surgical History:  Procedure Laterality Date  . NO PAST SURGERIES      No Known Allergies   Outpatient Medications Prior to Visit  Medication Sig Dispense Refill  . amLODipine (NORVASC) 10 MG tablet Take 1 tablet (10 mg total) by mouth daily. 30 tablet 3  . levothyroxine (SYNTHROID, LEVOTHROID) 50 MCG tablet Take 1 tablet (50 mcg total) by mouth daily before  breakfast. MUST MAKE APPT FOR FURTHER REFILLS 30 tablet 0  . methocarbamol (ROBAXIN) 500 MG tablet Take 1 tablet by mouth 2 (two) times daily as needed.  3  . cloNIDine (CATAPRES) 0.1 MG tablet Take 1 tablet (0.1 mg total) by mouth 2 (two) times daily. 60 tablet 3  . hydrochlorothiazide (HYDRODIURIL) 25 MG tablet Take 1 tablet (25 mg total) by mouth daily. 30 tablet 6  . albuterol (PROVENTIL HFA) 108 (90 Base) MCG/ACT inhaler Inhale 2 puffs into the lungs every 6 (six) hours as needed for wheezing or shortness of breath. (Patient not taking: Reported on 06/28/2017) 1 Inhaler 1  . atorvastatin (LIPITOR) 20 MG tablet Take 1 tablet (20 mg total) by mouth daily. MUST MAKE APPT FOR FURTHER REFILLS (Patient not taking: Reported on 07/23/2018) 30 tablet 0  . Multiple Vitamins-Minerals (MULTIVITAMIN ADULT PO) Take 1 tablet by mouth daily.    Marland Kitchen omeprazole (PRILOSEC) 20 MG capsule Take 1 capsule (20 mg total) by mouth daily. (Patient not taking: Reported on 07/23/2018) 20 capsule 0  . ondansetron (ZOFRAN-ODT) 4 MG disintegrating tablet Take 1 tablet (4 mg total) by mouth every 8 (eight) hours as needed for nausea or vomiting. (Patient not taking: Reported on 07/23/2018) 12 tablet 0  . SUMAtriptan (IMITREX) 100 MG tablet Take 1 tab at the onset of a migraine, may repeat in 2 hours if headache persists or recurs.  Maximum total daily dose is  200 mg. (Patient not taking: Reported on 07/23/2018) 30 tablet 2  . topiramate (TOPAMAX) 100 MG tablet Take 1 tablet (100 mg total) by mouth 2 (two) times daily. (Patient not taking: Reported on 07/23/2018) 60 tablet 3   No facility-administered medications prior to visit.     ROS Review of Systems  Constitutional: Negative for activity change and appetite change.  HENT: Negative for sinus pressure and sore throat.   Eyes: Negative for visual disturbance.  Respiratory: Negative for cough, chest tightness and shortness of breath.   Cardiovascular: Negative for chest pain  and leg swelling.  Gastrointestinal: Negative for abdominal distention, abdominal pain, constipation and diarrhea.  Endocrine: Negative.   Genitourinary: Negative for dysuria.  Musculoskeletal: Negative for joint swelling and myalgias.  Skin: Negative for rash.  Allergic/Immunologic: Negative.   Neurological: Negative for weakness, light-headedness and numbness.  Psychiatric/Behavioral: Negative for dysphoric mood and suicidal ideas.    Objective:  BP (!) 150/110   Pulse 71   Temp 97.8 F (36.6 C) (Oral)   Ht 6' (1.829 m)   Wt 236 lb (107 kg)   SpO2 97%   BMI 32.01 kg/m   BP/Weight 09/09/2018 08/01/2018 07/23/2018  Systolic BP 150 149 190  Diastolic BP 110 95 100  Wt. (Lbs) 236 - 225  BMI 32.01 - 30.52  Some encounter information is confidential and restricted. Go to Review Flowsheets activity to see all data.      Physical Exam Constitutional:      Appearance: He is well-developed.  Cardiovascular:     Rate and Rhythm: Normal rate.     Heart sounds: Normal heart sounds. No murmur.  Pulmonary:     Effort: Pulmonary effort is normal.     Breath sounds: Normal breath sounds. No wheezing or rales.  Chest:     Chest wall: No tenderness.  Abdominal:     General: Bowel sounds are normal. There is no distension.     Palpations: Abdomen is soft. There is no mass.     Tenderness: There is no abdominal tenderness.  Musculoskeletal: Normal range of motion.  Neurological:     Mental Status: He is alert and oriented to person, place, and time.  Psychiatric:        Mood and Affect: Mood normal.        Behavior: Behavior normal.     CMP Latest Ref Rng & Units 07/17/2018 06/28/2017 04/15/2017  Glucose 70 - 99 mg/dL 80 387(F102(H) 643(P103(H)  BUN 6 - 20 mg/dL 12 9 14   Creatinine 0.61 - 1.24 mg/dL 2.950.90 1.880.95 4.160.89  Sodium 135 - 145 mmol/L 138 139 137  Potassium 3.5 - 5.1 mmol/L 3.9 3.9 3.5  Chloride 98 - 111 mmol/L 103 99 103  CO2 20 - 29 mmol/L - 25 24  Calcium 8.7 - 10.2 mg/dL - 9.8  9.5  Total Protein 6.0 - 8.5 g/dL - 7.5 6.9  Total Bilirubin 0.0 - 1.2 mg/dL - 0.9 1.0  Alkaline Phos 39 - 117 IU/L - 39 38  AST 0 - 40 IU/L - 21 23  ALT 0 - 44 IU/L - 22 26    Lab Results  Component Value Date   TSH 3.850 06/28/2017    Assessment & Plan:   1. Chronic migraine without aura without status migrainosus, not intractable Controlled - topiramate (TOPAMAX) 100 MG tablet; Take 1 tablet (100 mg total) by mouth 2 (two) times daily.  Dispense: 60 tablet; Refill: 6 - SUMAtriptan (IMITREX) 100 MG tablet; Take  1 tab at the onset of a migraine, may repeat in 2 hours if headache persists or recurs.  Maximum total daily dose is 200 mg.  Dispense: 30 tablet; Refill: 2  2. Hypothyroidism due to acquired atrophy of thyroid We will need to check thyroid levels especially the setting of weight gain - TSH - T4, free  3. Chronic midline low back pain without sciatica Pain is absent at this time Advised to use ibuprofen, heating pad  4. Weight gain Weight gain of 11 pounds in the last 6 weeks Unsure if this is an error We will check thyroid function Discussed increasing physical activity and with cutting back on portion sizes  5. Need for immunization against influenza - Flu Vaccine QUAD 36+ mos IM  6.  Hypertension Uncontrolled Discontinue clonidine and HCTZ and commenced losartan/HCTZ Commence amlodipine Counseled on blood pressure goal of less than 130/80, low-sodium, DASH diet, medication compliance, 150 minutes of moderate intensity exercise per week. Discussed medication compliance, adverse effects.  Meds ordered this encounter  Medications  . ibuprofen (ADVIL,MOTRIN) 600 MG tablet    Sig: Take 1 tablet (600 mg total) by mouth every 8 (eight) hours as needed.    Dispense:  30 tablet    Refill:  1  . topiramate (TOPAMAX) 100 MG tablet    Sig: Take 1 tablet (100 mg total) by mouth 2 (two) times daily.    Dispense:  60 tablet    Refill:  6  . SUMAtriptan (IMITREX)  100 MG tablet    Sig: Take 1 tab at the onset of a migraine, may repeat in 2 hours if headache persists or recurs.  Maximum total daily dose is 200 mg.    Dispense:  30 tablet    Refill:  2  . losartan-hydrochlorothiazide (HYZAAR) 100-25 MG tablet    Sig: Take 1 tablet by mouth daily.    Dispense:  30 tablet    Refill:  6    Discontinue clonidine, discontinue HCTZ    Follow-up: Return in about 3 months (around 12/09/2018) for Follow-up of chronic medical conditions.   Hoy RegisterEnobong Merita Hawks MD

## 2018-09-10 ENCOUNTER — Other Ambulatory Visit: Payer: Self-pay | Admitting: Family Medicine

## 2018-09-10 ENCOUNTER — Telehealth: Payer: Self-pay

## 2018-09-10 ENCOUNTER — Ambulatory Visit: Payer: PRIVATE HEALTH INSURANCE | Attending: Family Medicine

## 2018-09-10 DIAGNOSIS — E034 Atrophy of thyroid (acquired): Secondary | ICD-10-CM

## 2018-09-10 DIAGNOSIS — Z111 Encounter for screening for respiratory tuberculosis: Secondary | ICD-10-CM | POA: Diagnosis not present

## 2018-09-10 LAB — TSH: TSH: 7.76 u[IU]/mL — ABNORMAL HIGH (ref 0.450–4.500)

## 2018-09-10 LAB — T4, FREE: Free T4: 1.08 ng/dL (ref 0.82–1.77)

## 2018-09-10 MED ORDER — LEVOTHYROXINE SODIUM 75 MCG PO TABS: 75.0000 ug | ORAL_TABLET | Freq: Every day | ORAL | 3 refills | Status: DC

## 2018-09-10 MED FILL — IBUPROFEN 600 MG TABLET: 600 | 10 days supply | Qty: 30 | Fill #0

## 2018-09-10 MED FILL — LOSARTAN-HCTZ 100-25 MG TAB: 100-25 | 30 days supply | Qty: 30 | Fill #0

## 2018-09-10 MED FILL — LEVOTHYROXINE 75 MCG TABLET: 75 | 30 days supply | Qty: 30 | Fill #0

## 2018-09-10 MED FILL — SUMATRIPTAN SUCC 100 MG TAB: 100 | 30 days supply | Qty: 9 | Fill #0

## 2018-09-10 MED FILL — TOPIRAMATE 100 MG TABS: 100 | 30 days supply | Qty: 60 | Fill #0

## 2018-09-10 NOTE — Telephone Encounter (Signed)
-----   Message from Hoy RegisterEnobong Newlin, MD sent at 09/10/2018  9:09 AM EST ----- Thyroid is abnormal which could explain his weight gain. I have sent a script for a new dose of Levothyroxine to his pharmacy.

## 2018-09-10 NOTE — Progress Notes (Signed)
Patient arrived at clinic  to get TB skin test done. Patient tolerated TB skin test well in right forearm. Patient was instructed to return on 09/12/18 for TB reading.

## 2018-09-10 NOTE — Telephone Encounter (Signed)
Patient was called and informed of lab results and medication being sent to pharmacy. 

## 2018-09-12 ENCOUNTER — Encounter: Payer: Self-pay | Admitting: Family Medicine

## 2018-09-12 LAB — TB SKIN TEST
INDURATION: 0 mm
TB Skin Test: NEGATIVE

## 2018-09-20 MED FILL — IBUPROFEN 600 MG TABLET: 600 | 10 days supply | Qty: 30 | Fill #0

## 2018-09-20 MED FILL — LOSARTAN-HCTZ 100-25 MG TAB: 100-25 | 30 days supply | Qty: 30 | Fill #0

## 2018-11-11 ENCOUNTER — Other Ambulatory Visit: Payer: Self-pay | Admitting: Family Medicine

## 2018-11-11 DIAGNOSIS — E034 Atrophy of thyroid (acquired): Secondary | ICD-10-CM

## 2018-11-22 MED FILL — LEVOTHYROXINE 75 MCG TABLET: 75 | 30 days supply | Qty: 30 | Fill #1

## 2018-12-24 MED FILL — LOSARTAN-HCTZ 100-25 MG TAB: 100-25 | 30 days supply | Qty: 30 | Fill #1

## 2019-01-21 MED FILL — AMLODIPINE BESYLATE 10 MG T: 10 | 30 days supply | Qty: 30 | Fill #1

## 2019-01-23 MED FILL — LEVOTHYROXINE 75 MCG TABLET: 75 | 30 days supply | Qty: 30 | Fill #2

## 2019-02-05 ENCOUNTER — Encounter: Payer: Self-pay | Admitting: Family Medicine

## 2019-02-05 ENCOUNTER — Ambulatory Visit: Payer: Self-pay | Attending: Family Medicine | Admitting: Family Medicine

## 2019-02-05 ENCOUNTER — Other Ambulatory Visit: Payer: Self-pay

## 2019-02-05 ENCOUNTER — Telehealth: Payer: Self-pay | Admitting: Family Medicine

## 2019-02-05 DIAGNOSIS — E785 Hyperlipidemia, unspecified: Secondary | ICD-10-CM | POA: Insufficient documentation

## 2019-02-05 DIAGNOSIS — I10 Essential (primary) hypertension: Secondary | ICD-10-CM

## 2019-02-05 DIAGNOSIS — E034 Atrophy of thyroid (acquired): Secondary | ICD-10-CM

## 2019-02-05 DIAGNOSIS — E7800 Pure hypercholesterolemia, unspecified: Secondary | ICD-10-CM

## 2019-02-05 NOTE — Progress Notes (Signed)
Virtual Visit via Telephone Note  I connected with Darren Barajas, on 02/05/2019 at 4:17 PM by telephone due to the COVID-19 pandemic and verified that I am speaking with the correct person using two identifiers.   Consent: I discussed the limitations, risks, security and privacy concerns of performing an evaluation and management service by telephone and the availability of in person appointments. I also discussed with the patient that there may be a patient responsible charge related to this service. The patient expressed understanding and agreed to proceed.   Location of Patient: Home  Location of Provider: Clinic   Persons participating in Telemedicine visit: Hansford Hirt Farrington-CMA Dr. Felecia Shelling    History of Present Illness: Darren Barajas is a 33 year old male with a history of migraines, uncontrolled hypertension, hyperlipidemia, hypothyroidism seen for follow-up visit. He has been unable to go to work for the last few days because his medications have made him drowsy he states with associated tremors.  He discontinued his antihypertensive but symptoms still persisted.  His last office visit with me was in 08/2018 and he states at that time he had been laid out of work and did not notice symptoms but then return to work last month when he noticed symptoms again.  On further questioning the only medications he has been taking his losartan/HCTZ 100/25 mg and levothyroxine.  Amlodipine appears on his med list however he has not been taking it due to confusion even though he obtained it from the pharmacy. His last thyroid function in 08/2018 revealed an elevated TSH at which time his levothyroxine dose was adjusted. He should also be on Lipitor however he has not been compliant with this because he chose to work on his lifestyle and his diet.  He was also treated for migraines with Imitrex and Topamax and informs me migraines are resolved and he no longer needs  any of those medications. He is needing a note for work indicating his medical conditions.   Past Medical History:  Diagnosis Date  . Anginal pain ([place])   . Chronic bronchitis ([place])    "get it q couple months; q couple weeks" (04/07/2015)  . Graves' disease    treated w/radioactive ioding ablation in 2001  . Headache    "weekly" (04/07/2015)  . Hypertension    Not currently taking medications   No Known Allergies  Current Outpatient Medications on File Prior to Visit  Medication Sig Dispense Refill  . amLODipine (NORVASC) 10 MG tablet Take 1 tablet (10 mg total) by mouth daily. 30 tablet 3  . ibuprofen (ADVIL,MOTRIN) 600 MG tablet Take 1 tablet (600 mg total) by mouth every 8 (eight) hours as needed. 30 tablet 1  . levothyroxine (SYNTHROID, LEVOTHROID) 75 MCG tablet Take 1 tablet (75 mcg total) by mouth daily before breakfast. 30 tablet 3  . losartan-hydrochlorothiazide (HYZAAR) 100-25 MG tablet Take 1 tablet by mouth daily. 30 tablet 6  . methocarbamol (ROBAXIN) 500 MG tablet Take 1 tablet by mouth 2 (two) times daily as needed.  3  . Multiple Vitamins-Minerals (MULTIVITAMIN ADULT PO) Take 1 tablet by mouth daily.    Marland Kitchen topiramate (TOPAMAX) 100 MG tablet Take 1 tablet (100 mg total) by mouth 2 (two) times daily. 60 tablet 6  . albuterol (PROVENTIL HFA) 108 (90 Base) MCG/ACT inhaler Inhale 2 puffs into the lungs every 6 (six) hours as needed for wheezing or shortness of breath. (Patient not taking: Reported on 06/28/2017) 1 Inhaler 1  . atorvastatin (LIPITOR) 20  MG tablet Take 1 tablet (20 mg total) by mouth daily. MUST MAKE APPT FOR FURTHER REFILLS (Patient not taking: Reported on 07/23/2018) 30 tablet 0  . omeprazole (PRILOSEC) 20 MG capsule Take 1 capsule (20 mg total) by mouth daily. (Patient not taking: Reported on 07/23/2018) 20 capsule 0  . ondansetron (ZOFRAN-ODT) 4 MG disintegrating tablet Take 1 tablet (4 mg total) by mouth every 8 (eight) hours as needed for nausea or vomiting.  (Patient not taking: Reported on 07/23/2018) 12 tablet 0  . SUMAtriptan (IMITREX) 100 MG tablet Take 1 tab at the onset of a migraine, may repeat in 2 hours if headache persists or recurs.  Maximum total daily dose is 200 mg. (Patient not taking: Reported on 02/05/2019) 30 tablet 2   No current facility-administered medications on file prior to visit.     Observations/Objective: Awake, alert, oriented x3 Not in acute distress  CMP Latest Ref Rng & Units 07/17/2018 06/28/2017 04/15/2017  Glucose 70 - 99 mg/dL 80 102(H) 103(H)  BUN 6 - 20 mg/dL '12 9 14  ' Creatinine 0.61 - 1.24 mg/dL 0.90 0.95 0.89  Sodium 135 - 145 mmol/L 138 139 137  Potassium 3.5 - 5.1 mmol/L 3.9 3.9 3.5  Chloride 98 - 111 mmol/L 103 99 103  CO2 20 - 29 mmol/L - 25 24  Calcium 8.7 - 10.2 mg/dL - 9.8 9.5  Total Protein 6.0 - 8.5 g/dL - 7.5 6.9  Total Bilirubin 0.0 - 1.2 mg/dL - 0.9 1.0  Alkaline Phos 39 - 117 IU/L - 39 38  AST 0 - 40 IU/L - 21 23  ALT 0 - 44 IU/L - 22 26    Lipid Panel     Component Value Date/Time   CHOL 264 (H) 06/28/2017 0953   TRIG 146 06/28/2017 0953   HDL 85 06/28/2017 0953   CHOLHDL 3.1 06/28/2017 0953   LDLCALC 150 (H) 06/28/2017 0953    Lab Results  Component Value Date   TSH 7.760 (H) 09/09/2018    Assessment and Plan: 1. Hypothyroidism due to acquired atrophy of thyroid Uncontrolled We will check thyroid panel especially with complaints of tremors Meanwhile continue current dose of levothyroxine and I will adjust his regimen after results are obtained - T4, free; Future - TSH; Future  2. Essential hypertension Uncontrolled due to noncompliance Emphasized the need to remain on amlodipine and losartan/HCTZ We will review his labs and if they do not explain his symptoms I will consider changing his antihypertensive - CMP14+EGFR; Future  3. Pure hypercholesterolemia Uncontrolled Noncompliant with statin Low-cholesterol diet If lipid panel is still elevated he will need to  restart Lipitor. - Lipid panel; Future   Follow Up Instructions: 2 months   I discussed the assessment and treatment plan with the patient. The patient was provided an opportunity to ask questions and all were answered. The patient agreed with the plan and demonstrated an understanding of the instructions.   The patient was advised to call back or seek an in-person evaluation if the symptoms worsen or if the condition fails to improve as anticipated.     I provided 25 minutes total of non-face-to-face time during this encounter including median intraservice time, reviewing previous notes, labs, imaging, medications, management and patient verbalized understanding.     Charlott Rakes, MD, FAAFP. [facility], [place]   02/05/2019, 4:17 PM

## 2019-02-05 NOTE — Telephone Encounter (Signed)
I put him on your schedule for a telephone call visit due to the note he wants because he asked to speak to you. +

## 2019-02-05 NOTE — Telephone Encounter (Signed)
Patient wants a note stating that he might be out of work due to his medical condition.

## 2019-02-05 NOTE — Telephone Encounter (Signed)
Unfortunately his medical conditions do not justify a note to be out of work.

## 2019-02-05 NOTE — Progress Notes (Signed)
Patient has been called and DOB has been verified. Patient has been screened and transferred to PCP to start phone visit.   Patient is having reactions to BP medications

## 2019-02-06 ENCOUNTER — Encounter: Payer: Self-pay | Admitting: Family Medicine

## 2019-02-06 ENCOUNTER — Ambulatory Visit: Payer: Self-pay | Attending: Family Medicine

## 2019-02-06 DIAGNOSIS — E7800 Pure hypercholesterolemia, unspecified: Secondary | ICD-10-CM

## 2019-02-06 DIAGNOSIS — I10 Essential (primary) hypertension: Secondary | ICD-10-CM

## 2019-02-06 DIAGNOSIS — E034 Atrophy of thyroid (acquired): Secondary | ICD-10-CM

## 2019-02-07 ENCOUNTER — Other Ambulatory Visit: Payer: Self-pay | Admitting: Family Medicine

## 2019-02-07 ENCOUNTER — Telehealth: Payer: Self-pay

## 2019-02-07 DIAGNOSIS — E034 Atrophy of thyroid (acquired): Secondary | ICD-10-CM

## 2019-02-07 LAB — LIPID PANEL
Chol/HDL Ratio: 3.7 ratio (ref 0.0–5.0)
Cholesterol, Total: 256 mg/dL — ABNORMAL HIGH (ref 100–199)
HDL: 70 mg/dL (ref >39)
LDL Calculated: 166 mg/dL — ABNORMAL HIGH (ref 0–99)
Triglycerides: 100 mg/dL (ref 0–149)
VLDL Cholesterol Cal: 20 mg/dL (ref 5–40)

## 2019-02-07 LAB — CMP14+EGFR
ALT: 26 IU/L (ref 0–44)
AST: 26 IU/L (ref 0–40)
Albumin/Globulin Ratio: 1.8 (ref 1.2–2.2)
Albumin: 5 g/dL (ref 4.0–5.0)
Alkaline Phosphatase: 52 IU/L (ref 39–117)
BUN/Creatinine Ratio: 11 (ref 9–20)
BUN: 10 mg/dL (ref 6–20)
Bilirubin Total: 0.8 mg/dL (ref 0.0–1.2)
CO2: 20 mmol/L (ref 20–29)
Calcium: 10.1 mg/dL (ref 8.7–10.2)
Chloride: 98 mmol/L (ref 96–106)
Creatinine, Ser: 0.94 mg/dL (ref 0.76–1.27)
GFR calc Af Amer: 124 mL/min/{1.73_m2} (ref >59)
GFR calc non Af Amer: 108 mL/min/{1.73_m2} (ref >59)
Globulin, Total: 2.8 g/dL (ref 1.5–4.5)
Glucose: 108 mg/dL — ABNORMAL HIGH (ref 65–99)
Potassium: 3.9 mmol/L (ref 3.5–5.2)
Sodium: 138 mmol/L (ref 134–144)
Total Protein: 7.8 g/dL (ref 6.0–8.5)

## 2019-02-07 LAB — TSH: TSH: 4.72 u[IU]/mL — ABNORMAL HIGH (ref 0.450–4.500)

## 2019-02-07 LAB — T4, FREE: Free T4: 1.14 ng/dL (ref 0.82–1.77)

## 2019-02-07 MED ORDER — ATORVASTATIN CALCIUM 20 MG PO TABS: 20.0000 mg | ORAL_TABLET | Freq: Every day | ORAL | 6 refills | Status: DC

## 2019-02-07 MED ORDER — LEVOTHYROXINE SODIUM 88 MCG PO TABS: 88.0000 ug | ORAL_TABLET | Freq: Every day | ORAL | 6 refills | Status: DC

## 2019-02-07 NOTE — Telephone Encounter (Signed)
Patient was called and voicemail is not set up to leave a message. 

## 2019-02-07 NOTE — Telephone Encounter (Signed)
Patient returned call. Please f/u °

## 2019-02-07 NOTE — Telephone Encounter (Signed)
-----   Message from Hoy Register, MD sent at 02/07/2019 11:57 AM EDT ----- Thyroid lab is still abnormal and I have increased his dose of levothyroxine.  Cholesterol is elevated and I have sent a refill for atorvastatin to his pharmacy.  Please advised to comply with medications, low-cholesterol diet and lifestyle modifications.

## 2019-02-12 NOTE — Telephone Encounter (Signed)
Pt name and DOB verbalized. He states he has been made aware of his results already, however he requested a note for work.   He states that he medications he is taking for blood pressure makes him unable to work when he takes it. He states he has cramping, shaking, and it slows him down to the point were he not able to be as productive. He has tried to take at night but still has these issues because he has to be at work at 4 am. His manager told him he would need to present a note from his PCP excusing him from work for a few weeks. He has been at his job for 4 years and unable to submit FMLA.   Pt request note for 2 weeks to 1 month. Please advise

## 2019-02-14 NOTE — Telephone Encounter (Signed)
His note for work is ready and he will need to be scheduled for an in person office visit.

## 2019-02-14 NOTE — Telephone Encounter (Signed)
Patient was called and informed of letter being ready for pick up and to schedule an in person appt with PCP.

## 2019-02-20 ENCOUNTER — Telehealth: Payer: Self-pay | Admitting: Family Medicine

## 2019-02-20 NOTE — Telephone Encounter (Signed)
Patient is called about med reaction

## 2019-02-20 NOTE — Telephone Encounter (Signed)
Patients call returned.  Patient identified by name and date of birth.  Patient advised to take his medications as not taking them could create a blood clot that could lead to a stroke.  Patient has appointment with Dr. Margarita Rana 03/03/2019 which patient was implored to keep appointment.  Patient advised if chest pains become constant, with shortness of breath to go to the ED or urgent care.  Patient acknowledged understanding of advice.

## 2019-02-20 NOTE — Telephone Encounter (Signed)
He was advised last week to schedule an in person visit with me for blood pressure and medication evaluation as he has complained of several side effects which he attributes to his medications.  Please advise that he is at risk of a stroke if he discontinues his medications prematurely and advise on  compliance until his visit with me.  Please discuss symptoms suggestive of a stroke and need for ED visit if that occurs. Thank you.

## 2019-02-20 NOTE — Telephone Encounter (Signed)
Patients call taken.  Patient identified by name and date of birth.  Patient called with a concern of elevated blood pressure.  Patient has not been compliant with medications due to a possible reaction to his blood pressure medicine.  Patient states that medication, he feels that the medication is causing red spots on his eyes.  Patients blood pressures have been 168/112 and 160/116.  Patient has not been compliant with amlodipine either.  Patient advised to take medications.  PCP would be advised and a return call will be made.  Patient acknowledged understanding of advice.

## 2019-02-24 ENCOUNTER — Emergency Department (HOSPITAL_COMMUNITY)
Admission: EM | Admit: 2019-02-24 | Discharge: 2019-02-24 | Disposition: A | Discharge status: Home / Self Care | Payer: PRIVATE HEALTH INSURANCE | Attending: Emergency Medicine | Admitting: Emergency Medicine

## 2019-02-24 ENCOUNTER — Emergency Department (HOSPITAL_COMMUNITY): Payer: PRIVATE HEALTH INSURANCE

## 2019-02-24 DIAGNOSIS — Z79899 Other long term (current) drug therapy: Secondary | ICD-10-CM | POA: Diagnosis not present

## 2019-02-24 DIAGNOSIS — R1013 Epigastric pain: Secondary | ICD-10-CM | POA: Diagnosis not present

## 2019-02-24 DIAGNOSIS — R0789 Other chest pain: Secondary | ICD-10-CM | POA: Diagnosis present

## 2019-02-24 DIAGNOSIS — E039 Hypothyroidism, unspecified: Secondary | ICD-10-CM | POA: Insufficient documentation

## 2019-02-24 DIAGNOSIS — I10 Essential (primary) hypertension: Secondary | ICD-10-CM | POA: Insufficient documentation

## 2019-02-24 DIAGNOSIS — F17210 Nicotine dependence, cigarettes, uncomplicated: Secondary | ICD-10-CM | POA: Insufficient documentation

## 2019-02-24 LAB — COMPREHENSIVE METABOLIC PANEL
ALT: 39 U/L (ref 0–44)
AST: 29 U/L (ref 15–41)
Albumin: 4.6 g/dL (ref 3.5–5.0)
Alkaline Phosphatase: 44 U/L (ref 38–126)
Anion gap: 12 (ref 5–15)
BUN: 12 mg/dL (ref 6–20)
CO2: 24 mmol/L (ref 22–32)
Calcium: 9.5 mg/dL (ref 8.9–10.3)
Chloride: 97 mmol/L — ABNORMAL LOW (ref 98–111)
Creatinine, Ser: 0.96 mg/dL (ref 0.61–1.24)
GFR calc Af Amer: >60 mL/min (ref >60)
GFR calc non Af Amer: >60 mL/min (ref >60)
Glucose, Bld: 133 mg/dL — ABNORMAL HIGH (ref 70–99)
Potassium: 3.5 mmol/L (ref 3.5–5.1)
Sodium: 133 mmol/L — ABNORMAL LOW (ref 135–145)
Total Bilirubin: 1.2 mg/dL (ref 0.3–1.2)
Total Protein: 8 g/dL (ref 6.5–8.1)

## 2019-02-24 LAB — CBC WITH DIFFERENTIAL/PLATELET
Abs Immature Granulocytes: 0.01 10*3/uL (ref 0.00–0.07)
Basophils Absolute: 0 10*3/uL (ref 0.0–0.1)
Basophils Relative: 0 %
Eosinophils Absolute: 0.1 10*3/uL (ref 0.0–0.5)
Eosinophils Relative: 2 %
HCT: 44.1 % (ref 39.0–52.0)
Hemoglobin: 14.9 g/dL (ref 13.0–17.0)
Immature Granulocytes: 0 %
Lymphocytes Relative: 43 %
Lymphs Abs: 2.1 10*3/uL (ref 0.7–4.0)
MCH: 30.5 pg (ref 26.0–34.0)
MCHC: 33.8 g/dL (ref 30.0–36.0)
MCV: 90.2 fL (ref 80.0–100.0)
Monocytes Absolute: 0.8 10*3/uL (ref 0.1–1.0)
Monocytes Relative: 16 %
Neutro Abs: 1.9 10*3/uL (ref 1.7–7.7)
Neutrophils Relative %: 39 %
Platelets: 256 10*3/uL (ref 150–400)
RBC: 4.89 MIL/uL (ref 4.22–5.81)
RDW: 12.8 % (ref 11.5–15.5)
WBC: 5 10*3/uL (ref 4.0–10.5)
nRBC: 0 % (ref 0.0–0.2)

## 2019-02-24 LAB — TROPONIN I: Troponin I: <0.03 ng/mL (ref <0.03)

## 2019-02-24 LAB — MAGNESIUM: Magnesium: 2 mg/dL (ref 1.7–2.4)

## 2019-02-24 LAB — CBG MONITORING, ED: Glucose-Capillary: 113 mg/dL — ABNORMAL HIGH (ref 70–99)

## 2019-02-24 LAB — LIPASE, BLOOD: Lipase: 24 U/L (ref 11–51)

## 2019-02-24 MED ORDER — FAMOTIDINE 20 MG PO TABS: 20.0000 mg | ORAL_TABLET | Freq: Two times a day (BID) | ORAL | 0 refills | Status: DC

## 2019-02-24 NOTE — Discharge Instructions (Signed)
As discussed, your evaluation today has been largely reassuring.  But, it is important that you monitor your condition carefully, and do not hesitate to return to the ED if you develop new, or concerning changes in your condition. ? ?Otherwise, please follow-up with your physician for appropriate ongoing care. ? ?

## 2019-02-24 NOTE — ED Triage Notes (Signed)
Pt in with c/o L upper abdominal pain/chest pain. Pt states his belly is more distended than normal, denies any n/v/d. Has been belching a lot today. Pain radiates to L arm

## 2019-02-24 NOTE — ED Provider Notes (Signed)
[facility]   CSN: [id number] Arrival date & time: 02/24/19  1148    History   Chief Complaint Chief Complaint  Patient presents with  . Chest Pain    HPI Darren Barajas is a 33 y.o. male.     HPI Patient presents with concern of chest pain. Patient has a history of hypertension, recently started taking medication again, after an unknown period of noncompliance. Today he woke up with pain in his epigastrium and lower sternal area, with some radiation to the left arm, described as tight. Some improvement with belching, and spontaneously, and currently patient has minimal discomfort. No dyspnea, no cough, no fever, no chills. Patient drinks, smokes.  Past Medical History:  Diagnosis Date  . Anginal pain ([facility])   . Chronic bronchitis ([facility])    "get it q couple months; q couple weeks" (04/07/2015)  . Graves' disease    treated w/radioactive ioding ablation in 2001  . Headache    "weekly" (04/07/2015)  . Hypertension    Not currently taking medications    Patient Active Problem List   Diagnosis Date Noted  . Hyperlipidemia 02/05/2019  . Hypertensive urgency 07/24/2018  . Tobacco use 07/24/2018  . Alcohol use 07/24/2018  . Hypertension 06/28/2017  . 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 Hypothyroidism due to acquired atrophy of thyroid 04/07/2015  . Hx of acute renal failure 04/07/2015  . Substance abuse [facility])     Past Surgical History:  Procedure Laterality Date  . NO PAST SURGERIES          Home Medications    Prior to Admission medications   Medication Sig Start Date End Date Taking? Authorizing Provider  amLODipine (NORVASC) 10 MG tablet Take 1 tablet (10 mg total) by mouth daily. 07/23/18  Yes Storm FriskWright, Donterius E, MD  ibuprofen (ADVIL,MOTRIN) 600 MG tablet Take 1 tablet (600 mg total) by mouth every 8 (eight) hours as needed. 09/09/18  Yes Hoy RegisterNewlin, Enobong, MD  levothyroxine (SYNTHROID) 88 MCG tablet Take 1  tablet (88 mcg total) by mouth daily before breakfast. 02/07/19  Yes Newlin, Enobong, MD  losartan-hydrochlorothiazide (HYZAAR) 100-25 MG tablet Take 1 tablet by mouth daily. 09/09/18  Yes Hoy RegisterNewlin, Enobong, MD  methocarbamol (ROBAXIN) 500 MG tablet Take 1 tablet by mouth 2 (two) times daily as needed for muscle spasms.  07/11/18  Yes [provider]  Multiple Vitamins-Minerals (MULTIVITAMIN ADULT PO) Take 1 tablet by mouth daily.   Yes [provider]  albuterol (PROVENTIL HFA) 108 (90 Base) MCG/ACT inhaler Inhale 2 puffs into the lungs every 6 (six) hours as needed for wheezing or shortness of breath. Patient not taking: Reported on 06/28/2017 04/02/17   Vivianne MasterNoel, Tiffany S, PA-C  atorvastatin (LIPITOR) 20 MG tablet Take 1 tablet (20 mg total) by mouth daily. Patient not taking: Reported on 02/24/2019 02/07/19   Hoy RegisterNewlin, Enobong, MD  famotidine (PEPCID) 20 MG tablet Take 1 tablet (20 mg total) by mouth 2 (two) times daily for 10 days. 02/24/19 03/06/19  Gerhard MunchLockwood, Ezme Duch, MD  omeprazole (PRILOSEC) 20 MG capsule Take 1 capsule (20 mg total) by mouth daily. Patient not taking: Reported on 07/23/2018 07/17/18   Wurst, GrenadaBrittany, PA-C  ondansetron (ZOFRAN-ODT) 4 MG disintegrating tablet Take 1 tablet (4 mg total) by mouth every 8 (eight) hours as needed for nausea or vomiting. Patient not taking: Reported on 07/23/2018 11/29/17   Mardella LaymanHagler, Brian, MD  SUMAtriptan (IMITREX) 100 MG tablet Take 1 tab at the onset of  a migraine, may repeat in 2 hours if headache persists or recurs.  Maximum total daily dose is 200 mg. Patient not taking: Reported on 02/05/2019 09/09/18   Charlott Rakes, MD  topiramate (TOPAMAX) 100 MG tablet Take 1 tablet (100 mg total) by mouth 2 (two) times daily. Patient not taking: Reported on 02/24/2019 09/09/18   Charlott Rakes, MD    Family History Family History  Problem Relation Age of Onset  . Hyperthyroidism Mother   . Hyperthyroidism Maternal Uncle     Social History  Social History   Tobacco Use  . Smoking status: Current Every Day Smoker    Packs/day: 1.00    Years: 10.00    Pack years: 10.00    Types: Cigarettes  . Smokeless tobacco: Never Used  Substance Use Topics  . Alcohol use: Yes    Alcohol/week: 36.0 standard drinks    Types: 36 Cans of beer per week    Comment: 04/07/2015 "12 pack beer 2-3 days/wk"  . Drug use: Yes    Types: MDMA (Ecstacy), Cocaine, Marijuana    Comment: 04/07/2015 "last used MDMA, Xanax 04/06/2015; haven't used coke in the last couple months"   Has 1 daughter, is engaged.  Allergies   Patient has no known allergies.   Review of Systems Review of Systems  Constitutional:       Per HPI, otherwise negative  HENT:       Per HPI, otherwise negative  Respiratory:       Per HPI, otherwise negative  Cardiovascular:       Per HPI, otherwise negative  Gastrointestinal: Negative for vomiting.  Endocrine:       Negative aside from HPI  Genitourinary:       Neg aside from HPI   Musculoskeletal:       Per HPI, otherwise negative  Skin: Negative.   Neurological: Negative for syncope.     Physical Exam Updated Vital Signs BP (!) 146/106   Pulse 62   Resp 14   SpO2 97%   Physical Exam Vitals signs and nursing note reviewed.  Constitutional:      General: He is not in acute distress.    Appearance: He is well-developed.  HENT:     Head: Normocephalic and atraumatic.  Eyes:     Conjunctiva/sclera: Conjunctivae normal.  Cardiovascular:     Rate and Rhythm: Normal rate and regular rhythm.  Pulmonary:     Effort: Pulmonary effort is normal. No respiratory distress.     Breath sounds: No stridor.  Abdominal:     General: There is no distension.  Skin:    General: Skin is warm and dry.  Neurological:     Mental Status: He is alert and oriented to person, place, and time.      ED Treatments / Results  Labs (all labs ordered are listed, but only abnormal results are displayed) Labs Reviewed   COMPREHENSIVE METABOLIC PANEL - Abnormal; Notable for the following components:      Result Value   Sodium 133 (*)    Chloride 97 (*)    Glucose, Bld 133 (*)    All other components within normal limits  CBG MONITORING, ED - Abnormal; Notable for the following components:   Glucose-Capillary 113 (*)    All other components within normal limits  MAGNESIUM  TROPONIN I  CBC WITH DIFFERENTIAL/PLATELET  LIPASE, BLOOD    EKG EKG Interpretation  Date/Time:  Monday February 24 2019 12:08:35 EDT Ventricular Rate:  140 PR  Interval:    QRS Duration: 111 QT Interval:  319 QTC Calculation: 487 R Axis:   68 Text Interpretation:  Sinus tachycardia Probable left atrial enlargement Borderline T abnormalities, inferior leads Borderline prolonged QT interval Artifact Abnormal ECG Confirmed by Gerhard MunchLockwood, Tayonna Bacha [phone]([id number]) on 02/24/2019 12:21:48 PM Also confirmed by Gerhard MunchLockwood, Royale Lennartz ([id number]), editor Barbette HairCassel, Kerry [phone]([id number])  on 02/24/2019 2:27:26 PM   Radiology Dg Chest Portable 1 View  Result Date: 02/24/2019 CLINICAL DATA:  Chest pressure. EXAM: PORTABLE CHEST 1 VIEW COMPARISON:  03/17/2017. FINDINGS: Mediastinum and hilar structures are normal. Cardiomegaly with normal pulmonary vascularity. No focal infiltrate. No pleural effusion or pneumothorax. No acute bony abnormality IMPRESSION: 1.  Cardiomegaly.  No pulmonary venous congestion. 2.  No acute abnormality. Electronically Signed   By: Maisie Fushomas  Register   On: 02/24/2019 13:27    Procedures Procedures (including critical care time)  Medications Ordered in ED Medications - No data to display   Initial Impression / Assessment and Plan / ED Course  I have reviewed the triage vital signs and the nursing notes.  Pertinent labs & imaging results that were available during my care of the patient were reviewed by me and considered in my medical decision making (see chart for details).        3:19 PM Patient awake, alert, in no distress symptoms have  resolved entirely. Had a long conversation about his presentation, there is some suspicion for gastroesophageal etiology Patient's blood pressure has diminished as well Patient encouraged to continue his recently resumed blood pressure medication regimen, follow-up with primary care, was prescribed a new reflux medication regimen as well. Patient discharged in stable condition.  Final Clinical Impressions(s) / ED Diagnoses   Final diagnoses:  Atypical chest pain    ED Discharge Orders         Ordered    famotidine (PEPCID) 20 MG tablet  2 times daily     02/24/19 1518           Gerhard MunchLockwood, Asahel Risden, MD 02/24/19 1520

## 2019-02-26 MED FILL — LOSARTAN-HCTZ 100-25 MG TAB: 100-25 | 30 days supply | Qty: 30 | Fill #2

## 2019-02-26 MED FILL — LEVOTHYROXINE 88 MCG TABLET: 88 | 30 days supply | Qty: 30 | Fill #0

## 2019-03-03 ENCOUNTER — Ambulatory Visit: Payer: Self-pay | Attending: Family Medicine | Admitting: Family Medicine

## 2019-03-03 ENCOUNTER — Other Ambulatory Visit: Payer: Self-pay

## 2019-03-03 ENCOUNTER — Encounter: Payer: Self-pay | Admitting: Family Medicine

## 2019-03-03 VITALS — BP 151/97 | HR 94 | Temp 98.2°F | Ht 72.0 in | Wt 249.0 lb

## 2019-03-03 DIAGNOSIS — I10 Essential (primary) hypertension: Secondary | ICD-10-CM

## 2019-03-03 DIAGNOSIS — Z720 Tobacco use: Secondary | ICD-10-CM

## 2019-03-03 DIAGNOSIS — F329 Major depressive disorder, single episode, unspecified: Secondary | ICD-10-CM

## 2019-03-03 DIAGNOSIS — E034 Atrophy of thyroid (acquired): Secondary | ICD-10-CM

## 2019-03-03 DIAGNOSIS — F419 Anxiety disorder, unspecified: Secondary | ICD-10-CM

## 2019-03-03 DIAGNOSIS — N528 Other male erectile dysfunction: Secondary | ICD-10-CM

## 2019-03-03 MED ORDER — SILDENAFIL CITRATE 50 MG PO TABS: 50.0000 mg | ORAL_TABLET | Freq: Every day | ORAL | 0 refills | Status: DC | PRN

## 2019-03-03 MED ORDER — FLUOXETINE HCL 20 MG PO TABS: 20.0000 mg | ORAL_TABLET | Freq: Every day | ORAL | 3 refills | Status: DC

## 2019-03-03 MED ORDER — HYDROXYZINE HCL 25 MG PO TABS: 25.0000 mg | ORAL_TABLET | Freq: Two times a day (BID) | ORAL | 1 refills | Status: DC | PRN

## 2019-03-03 MED ORDER — IBUPROFEN 800 MG PO TABS: 800.0000 mg | ORAL_TABLET | Freq: Two times a day (BID) | ORAL | 1 refills | Status: DC | PRN

## 2019-03-03 MED ORDER — AMLODIPINE BESYLATE 10 MG PO TABS: 10.0000 mg | ORAL_TABLET | Freq: Every day | ORAL | 6 refills | Status: DC

## 2019-03-03 MED ORDER — METOPROLOL TARTRATE 25 MG PO TABS: 25.0000 mg | ORAL_TABLET | Freq: Two times a day (BID) | ORAL | 3 refills | Status: DC

## 2019-03-03 MED ORDER — LOSARTAN POTASSIUM-HCTZ 100-25 MG PO TABS: 1.0000 | ORAL_TABLET | Freq: Every day | ORAL | 6 refills | Status: DC

## 2019-03-03 MED FILL — ?AMLODIPINE BESYLATE 10 MG: 10 | 30 days supply | Qty: 30 | Fill #0

## 2019-03-03 MED FILL — hydrOXYzine HCL 25 MG TABS: 25 | 30 days supply | Qty: 60 | Fill #0

## 2019-03-03 MED FILL — METOPROLOL TARTRATE 25 MG T: 25 | 30 days supply | Qty: 60 | Fill #0

## 2019-03-03 MED FILL — ?SILDENAFIL CITRATE 50MG TA: 50 | 30 days supply | Qty: 10 | Fill #0

## 2019-03-03 NOTE — Progress Notes (Signed)
Patient is not sleeping and states that he has anxiety.  Patient has questions about Thyroid condition.

## 2019-03-03 NOTE — Progress Notes (Signed)
Subjective:  Patient ID: Darren Patesatrick A Enberg, male    DOB: November 09, 1985  Age: 33 y.o. MRN: [id number]  CC: Hypothyroidism   HPI Darren Barajas is a 33 year old male with a history of migraines, uncontrolled hypertension, hyperlipidemia, hypothyroidism seen for follow-up visit. He has complained of several somatic symptoms over the last couple weeks which he attributed to side effects from his antihypertensives.  Symptoms included tremors, drowsiness and he had requested to be out of work until he got used to his medications. 1 week ago he had an ED presentation with atypical chest pain, ruled out for ACS, chest x-ray normal and treated as GERD.  Today he endorses anxiety and this has affected his sleep.  He endorses being stressed and informs me he needs something to help with his nerves.  His girlfriend had some Klonopin tablets which he took and this improved his symptoms.  Sometimes thinks he is depressed but he denies suicidal ideations or intents has never been treated for anxiety or depression in the past. He also complains of erectile dysfunction and would like medication for treatment. He has been compliant with his medications.  At the moment he smokes cigarettes and feels he should be able to quit by trying on his own.  He has cut back to about 1 cigarette/day. Past Medical History:  Diagnosis Date  . Anginal pain (HCC)   . Chronic bronchitis (HCC)    "get it q couple months; q couple weeks" (04/07/2015)  . Graves' disease    treated w/radioactive ioding ablation in 2001  . Headache    "weekly" (04/07/2015)  . Hypertension    Not currently taking medications    Past Surgical History:  Procedure Laterality Date  . NO PAST SURGERIES      Family History  Problem Relation Age of Onset  . Hyperthyroidism Mother   . Hyperthyroidism Maternal Uncle     No Known Allergies  Outpatient Medications Prior to Visit  Medication Sig Dispense Refill  . levothyroxine (SYNTHROID)  88 MCG tablet Take 1 tablet (88 mcg total) by mouth daily before breakfast. 30 tablet 6  . Multiple Vitamins-Minerals (MULTIVITAMIN ADULT PO) Take 1 tablet by mouth daily.    Marland Kitchen. omeprazole (PRILOSEC) 20 MG capsule Take 1 capsule (20 mg total) by mouth daily. 20 capsule 0  . amLODipine (NORVASC) 10 MG tablet Take 1 tablet (10 mg total) by mouth daily. 30 tablet 3  . ibuprofen (ADVIL,MOTRIN) 600 MG tablet Take 1 tablet (600 mg total) by mouth every 8 (eight) hours as needed. 30 tablet 1  . losartan-hydrochlorothiazide (HYZAAR) 100-25 MG tablet Take 1 tablet by mouth daily. 30 tablet 6  . atorvastatin (LIPITOR) 20 MG tablet Take 1 tablet (20 mg total) by mouth daily. (Patient not taking: Reported on 02/24/2019) 30 tablet 6  . famotidine (PEPCID) 20 MG tablet Take 1 tablet (20 mg total) by mouth 2 (two) times daily for 10 days. (Patient not taking: Reported on 03/03/2019) 20 tablet 0  . methocarbamol (ROBAXIN) 500 MG tablet Take 1 tablet by mouth 2 (two) times daily as needed for muscle spasms.   3  . ondansetron (ZOFRAN-ODT) 4 MG disintegrating tablet Take 1 tablet (4 mg total) by mouth every 8 (eight) hours as needed for nausea or vomiting. (Patient not taking: Reported on 07/23/2018) 12 tablet 0  . topiramate (TOPAMAX) 100 MG tablet Take 1 tablet (100 mg total) by mouth 2 (two) times daily. (Patient not taking: Reported on 02/24/2019) 60 tablet 6  .  albuterol (PROVENTIL HFA) 108 (90 Base) MCG/ACT inhaler Inhale 2 puffs into the lungs every 6 (six) hours as needed for wheezing or shortness of breath. (Patient not taking: Reported on 06/28/2017) 1 Inhaler 1  . SUMAtriptan (IMITREX) 100 MG tablet Take 1 tab at the onset of a migraine, may repeat in 2 hours if headache persists or recurs.  Maximum total daily dose is 200 mg. (Patient not taking: Reported on 02/05/2019) 30 tablet 2   No facility-administered medications prior to visit.      ROS Review of Systems  Constitutional: Negative for activity change  and appetite change.  HENT: Negative for sinus pressure and sore throat.   Eyes: Negative for visual disturbance.  Respiratory: Negative for cough, chest tightness and shortness of breath.   Cardiovascular: Negative for chest pain and leg swelling.  Gastrointestinal: Negative for abdominal distention, abdominal pain, constipation and diarrhea.  Endocrine: Negative.   Genitourinary: Negative for dysuria.  Musculoskeletal: Negative for joint swelling and myalgias.  Skin: Negative for rash.  Allergic/Immunologic: Negative.   Neurological: Negative for weakness, light-headedness and numbness.  Psychiatric/Behavioral: Negative for dysphoric mood and suicidal ideas.    Objective:  BP (!) 151/97   Pulse 94   Temp 98.2 F (36.8 C) (Oral)   Ht 6' (1.829 m)   Wt 249 lb (112.9 kg)   SpO2 96%   BMI 33.77 kg/m   BP/Weight 03/03/2019 02/24/2019 09/09/2018  Systolic BP 151 150 150  Diastolic BP 97 107 110  Wt. (Lbs) 249 - 236  BMI 33.77 - 32.01  Some encounter information is confidential and restricted. Go to Review Flowsheets activity to see all data.      Physical Exam Constitutional:      Appearance: He is well-developed.  Cardiovascular:     Rate and Rhythm: Normal rate.     Heart sounds: Normal heart sounds. No murmur.  Pulmonary:     Effort: Pulmonary effort is normal.     Breath sounds: Normal breath sounds. No wheezing or rales.  Chest:     Chest wall: No tenderness.  Abdominal:     General: Bowel sounds are normal. There is no distension.     Palpations: Abdomen is soft. There is no mass.     Tenderness: There is no abdominal tenderness.  Musculoskeletal: Normal range of motion.  Neurological:     Mental Status: He is alert and oriented to person, place, and time.     CMP Latest Ref Rng & Units 02/24/2019 02/06/2019 07/17/2018  Glucose 70 - 99 mg/dL 045(W133(H) 098(J108(H) 80  BUN 6 - 20 mg/dL 12 10 12   Creatinine 0.61 - 1.24 mg/dL 1.910.96 4.780.94 2.950.90  Sodium 135 - 145 mmol/L  133(L) 138 138  Potassium 3.5 - 5.1 mmol/L 3.5 3.9 3.9  Chloride 98 - 111 mmol/L 97(L) 98 103  CO2 22 - 32 mmol/L 24 20 -  Calcium 8.9 - 10.3 mg/dL 9.5 62.110.1 -  Total Protein 6.5 - 8.1 g/dL 8.0 7.8 -  Total Bilirubin 0.3 - 1.2 mg/dL 1.2 0.8 -  Alkaline Phos 38 - 126 U/L 44 52 -  AST 15 - 41 U/L 29 26 -  ALT 0 - 44 U/L 39 26 -    Lipid Panel     Component Value Date/Time   CHOL 256 (H) 02/06/2019 1237   TRIG 100 02/06/2019 1237   HDL 70 02/06/2019 1237   CHOLHDL 3.7 02/06/2019 1237   LDLCALC 166 (H) 02/06/2019 1237    CBC  Component Value Date/Time   WBC 5.0 02/24/2019 1215   RBC 4.89 02/24/2019 1215   HGB 14.9 02/24/2019 1215   HCT 44.1 02/24/2019 1215   PLT 256 02/24/2019 1215   MCV 90.2 02/24/2019 1215   MCH 30.5 02/24/2019 1215   MCHC 33.8 02/24/2019 1215   RDW 12.8 02/24/2019 1215   LYMPHSABS 2.1 02/24/2019 1215   MONOABS 0.8 02/24/2019 1215   EOSABS 0.1 02/24/2019 1215   BASOSABS 0.0 02/24/2019 1215    No results found for: HGBA1C    [facility]  PHQ-9 Total Score  9     GAD 7 : Generalized Anxiety Score 03/03/2019 09/09/2018 07/23/2018 08/28/2017  Nervous, Anxious, on Edge 3 0 0 0  Control/stop worrying 3 0 1 2  Worry too much - different things 3 0 1 0  Trouble relaxing 3 1 1 1   Restless 1 0 1 0  Easily annoyed or irritable 3 1 2 1   Afraid - awful might happen 1 0 0 0  Total GAD 7 Score 17 2 6 4   Some encounter information is confidential and restricted. Go to Review Flowsheets activity to see all data.     Assessment & Plan:   1. Anxiety and depression PHQ 9 score of 9 and GAD 7 score of 17 Could explain most of the symptoms he previously attributed to adverse effects from his medications - hydrOXYzine (ATARAX/VISTARIL) 25 MG tablet; Take 1 tablet (25 mg total) by mouth 2 (two) times daily as needed.  Dispense: 60 tablet; Refill: 1 - FLUoxetine (PROZAC) 20 MG tablet; Take 1 tablet (20  mg total) by mouth daily.  Dispense: 30 tablet; Refill: 3  2. Essential hypertension Improved but not at goal We will add metoprolol which will help with palpitations whenever he has done Counseled on blood pressure goal of less than 130/80, low-sodium, DASH diet, medication compliance, 150 minutes of moderate intensity exercise per week. Discussed medication compliance, adverse effects. - metoprolol tartrate (LOPRESSOR) 25 MG tablet; Take 1 tablet (25 mg total) by mouth 2 (two) times daily.  Dispense: 180 tablet; Refill: 3 - ibuprofen (ADVIL) 800 MG tablet; Take 1 tablet (800 mg total) by mouth every 12 (twelve) hours as needed.  Dispense: 60 tablet; Refill: 1 - losartan-hydrochlorothiazide (HYZAAR) 100-25 MG tablet; Take 1 tablet by mouth daily.  Dispense: 30 tablet; Refill: 6 - amLODipine (NORVASC) 10 MG tablet; Take 1 tablet (10 mg total) by mouth daily.  Dispense: 30 tablet; Refill: 6  3. Tobacco use Spent 3 minutes counseling on smoking cessation and he is wanting to try on his own, declines nicotine replacement therapy at this time. Would love to use Wellbutrin which will aid with smoking cessation but he declines  4. Hypothyroidism due to acquired atrophy of thyroid Controlled We have reviewed current administration of Levothyroxine - first thing in the morning on empty stomach and 30 minutes before other medicines or meals.  5. Other male erectile dysfunction - sildenafil (VIAGRA) 50 MG tablet; Take 1 tablet (50 mg total) by mouth daily as needed for erectile dysfunction (at least 24 hours between doses).  Dispense: 10 tablet; Refill: 0   Meds ordered this encounter  Medications  . hydrOXYzine (ATARAX/VISTARIL) 25 MG tablet    Sig: Take 1 tablet (25 mg total) by mouth 2 (two) times daily as needed.    Dispense:  60 tablet    Refill:  1  . metoprolol tartrate (LOPRESSOR) 25 MG  tablet    Sig: Take 1 tablet (25 mg total) by mouth 2 (two) times daily.    Dispense:  180 tablet     Refill:  3  . FLUoxetine (PROZAC) 20 MG tablet    Sig: Take 1 tablet (20 mg total) by mouth daily.    Dispense:  30 tablet    Refill:  3  . ibuprofen (ADVIL) 800 MG tablet    Sig: Take 1 tablet (800 mg total) by mouth every 12 (twelve) hours as needed.    Dispense:  60 tablet    Refill:  1  . losartan-hydrochlorothiazide (HYZAAR) 100-25 MG tablet    Sig: Take 1 tablet by mouth daily.    Dispense:  30 tablet    Refill:  6  . amLODipine (NORVASC) 10 MG tablet    Sig: Take 1 tablet (10 mg total) by mouth daily.    Dispense:  30 tablet    Refill:  6  . sildenafil (VIAGRA) 50 MG tablet    Sig: Take 1 tablet (50 mg total) by mouth daily as needed for erectile dysfunction (at least 24 hours between doses).    Dispense:  10 tablet    Refill:  0    Follow-up: Return in about 3 months (around 06/03/2019) for medical conditions.       Charlott Rakes, MD, FAAFP. [facility] [place] [place]   03/03/2019, 10:49 AM

## 2019-03-03 NOTE — Patient Instructions (Signed)
Steps to Quit Smoking    Smoking tobacco can be bad for your health. It can also affect almost every organ in your body. Smoking puts you and people around you at risk for many serious long-lasting (chronic) diseases. Quitting smoking is hard, but it is one of the best things that you can do for your health. It is never too late to quit.  What are the benefits of quitting smoking?  When you quit smoking, you lower your risk for getting serious diseases and conditions. They can include:  · Lung cancer or lung disease.  · Heart disease.  · Stroke.  · Heart attack.  · Not being able to have children (infertility).  · Weak bones (osteoporosis) and broken bones (fractures).  If you have coughing, wheezing, and shortness of breath, those symptoms may get better when you quit. You may also get sick less often. If you are pregnant, quitting smoking can help to lower your chances of having a baby of low birth weight.  What can I do to help me quit smoking?  Talk with your doctor about what can help you quit smoking. Some things you can do (strategies) include:  · Quitting smoking totally, instead of slowly cutting back how much you smoke over a period of time.  · Going to in-person counseling. You are more likely to quit if you go to many counseling sessions.  · Using resources and support systems, such as:  ? Online chats with a counselor.  ? Phone quitlines.  ? Printed self-help materials.  ? Support groups or group counseling.  ? Text messaging programs.  ? Mobile phone apps or applications.  · Taking medicines. Some of these medicines may have nicotine in them. If you are pregnant or breastfeeding, do not take any medicines to quit smoking unless your doctor says it is okay. Talk with your doctor about counseling or other things that can help you.  Talk with your doctor about using more than one strategy at the same time, such as taking medicines while you are also going to in-person counseling. This can help make  quitting easier.  What things can I do to make it easier to quit?  Quitting smoking might feel very hard at first, but there is a lot that you can do to make it easier. Take these steps:  · Talk to your family and friends. Ask them to support and encourage you.  · Call phone quitlines, reach out to support groups, or work with a counselor.  · Ask people who smoke to not smoke around you.  · Avoid places that make you want (trigger) to smoke, such as:  ? Bars.  ? Parties.  ? Smoke-break areas at work.  · Spend time with people who do not smoke.  · Lower the stress in your life. Stress can make you want to smoke. Try these things to help your stress:  ? Getting regular exercise.  ? Deep-breathing exercises.  ? Yoga.  ? Meditating.  ? Doing a body scan. To do this, close your eyes, focus on one area of your body at a time from head to toe, and notice which parts of your body are tense. Try to relax the muscles in those areas.  · Download or buy apps on your mobile phone or tablet that can help you stick to your quit plan. There are many free apps, such as QuitGuide from the CDC (Centers for Disease Control and Prevention). You can find more   support from smokefree.gov and other websites.  This information is not intended to replace advice given to you by your health care provider. Make sure you discuss any questions you have with your health care provider.  Document Released: 06/24/2009 Document Revised: 04/25/2016 Document Reviewed: 01/12/2015  Elsevier Interactive Patient Education © 2019 [employer]vier Inc.

## 2019-03-07 MED FILL — ?ATORVASTATIN 20 MG TABLET: 20 | 30 days supply | Qty: 30 | Fill #0

## 2019-03-25 ENCOUNTER — Telehealth: Payer: Self-pay | Admitting: Family Medicine

## 2019-03-25 NOTE — Telephone Encounter (Signed)
Patients call returned.  Patient identified by name and date of birth.  Patient states that even after he was prescribed new medication that he is still having chest pain at work.  Patient concerned that thyroid medication is the cause.  Talked to patient about diet and exercise.  Patient grateful for encouragement.  Patient has appointment in am, told to discuss with doctor about situation then.  Patient acknowledged understanding of advice.

## 2019-03-25 NOTE — Telephone Encounter (Signed)
Patient called stating he has been taking his medication everyday and is not sure if it is a side effect from a medication. Patient states he gets chest pain and left arm pain. Please follow up

## 2019-03-26 ENCOUNTER — Ambulatory Visit: Payer: PRIVATE HEALTH INSURANCE | Attending: Family Medicine | Admitting: Licensed Clinical Social Worker

## 2019-03-26 ENCOUNTER — Ambulatory Visit: Payer: Self-pay | Attending: Family Medicine | Admitting: Family Medicine

## 2019-03-26 ENCOUNTER — Other Ambulatory Visit: Payer: Self-pay

## 2019-03-26 ENCOUNTER — Encounter: Payer: Self-pay | Admitting: Family Medicine

## 2019-03-26 VITALS — BP 143/82 | HR 84 | Ht 72.0 in | Wt 245.6 lb

## 2019-03-26 DIAGNOSIS — F32A Depression, unspecified: Secondary | ICD-10-CM

## 2019-03-26 DIAGNOSIS — I10 Essential (primary) hypertension: Secondary | ICD-10-CM | POA: Insufficient documentation

## 2019-03-26 DIAGNOSIS — R61 Generalized hyperhidrosis: Secondary | ICD-10-CM | POA: Insufficient documentation

## 2019-03-26 DIAGNOSIS — Z9114 Patient's other noncompliance with medication regimen: Secondary | ICD-10-CM | POA: Insufficient documentation

## 2019-03-26 DIAGNOSIS — E785 Hyperlipidemia, unspecified: Secondary | ICD-10-CM | POA: Insufficient documentation

## 2019-03-26 DIAGNOSIS — F419 Anxiety disorder, unspecified: Secondary | ICD-10-CM

## 2019-03-26 DIAGNOSIS — R251 Tremor, unspecified: Secondary | ICD-10-CM | POA: Insufficient documentation

## 2019-03-26 DIAGNOSIS — E034 Atrophy of thyroid (acquired): Secondary | ICD-10-CM | POA: Insufficient documentation

## 2019-03-26 DIAGNOSIS — F329 Major depressive disorder, single episode, unspecified: Secondary | ICD-10-CM

## 2019-03-26 DIAGNOSIS — R079 Chest pain, unspecified: Secondary | ICD-10-CM | POA: Insufficient documentation

## 2019-03-26 DIAGNOSIS — R634 Abnormal weight loss: Secondary | ICD-10-CM | POA: Insufficient documentation

## 2019-03-26 DIAGNOSIS — Z7901 Long term (current) use of anticoagulants: Secondary | ICD-10-CM | POA: Insufficient documentation

## 2019-03-26 DIAGNOSIS — Z79899 Other long term (current) drug therapy: Secondary | ICD-10-CM | POA: Insufficient documentation

## 2019-03-26 DIAGNOSIS — E0500 Thyrotoxicosis with diffuse goiter without thyrotoxic crisis or storm: Secondary | ICD-10-CM | POA: Insufficient documentation

## 2019-03-26 DIAGNOSIS — E039 Hypothyroidism, unspecified: Secondary | ICD-10-CM | POA: Insufficient documentation

## 2019-03-26 MED ORDER — FLUOXETINE HCL 20 MG PO TABS: 20.0000 mg | ORAL_TABLET | Freq: Every day | ORAL | 3 refills | Status: DC

## 2019-03-26 MED ORDER — CLONIDINE HCL 0.1 MG PO TABS: 0.1000 mg | ORAL_TABLET | Freq: Every day | ORAL | 3 refills | Status: DC

## 2019-03-26 MED FILL — cloNIDine HCL 0.1 MG TABS: 0.1 | 30 days supply | Qty: 30 | Fill #0

## 2019-03-26 MED FILL — FLUoxetine HCL 20 MG TABS: 20 | 30 days supply | Qty: 30 | Fill #0

## 2019-03-26 NOTE — Progress Notes (Signed)
Subjective:  Patient ID: Darren Barajas, male    DOB: 10/15/1985  Age: 33 y.o. MRN: [id number]  CC: Hypertension   HPI Darren Barajas is a 33 year old male with a history of migraines, uncontrolled hypertension, hyperlipidemia, hypothyroidism seen for follow-up visit. He has complained of several somatic symptoms over the last couple of weeks which he attributes to side effects from his antihypertensives. Today he complains of chest pain which he attributes to metoprolol which was commenced at his last visit for hypertension. He has had night sweats, tremors and he attributes this to his medications as well.  He is thinking the increased dose of his levothyroxine could be also causing his symptoms as his TSH was elevated and levothyroxine dose had been increased. With regards to his anxiety I had placed him on Prozac and hydroxyzine however he never took any of those medications.  He informs me Prozac gave him delusions and then later on in the conversation he admits to not picking it up from the pharmacy because his aunt had a problem with Prozac. He is under some stress at work due to the ongoing pandemic and the fact that his coworkers and not exercising social distancing and other precautions.  He becomes teary in the exam room.  Request to be out of work for 2 weeks to give him time to adjust his medication and distress.  Past Medical History:  Diagnosis Date  . Anginal pain ([place])   . Chronic bronchitis ([place])    "get it q couple months; q couple weeks" (04/07/2015)  . Graves' disease    treated w/radioactive ioding ablation in 2001  . Headache    "weekly" (04/07/2015)  . Hypertension    Not currently taking medications    Past Surgical History:  Procedure Laterality Date  . NO PAST SURGERIES      Family History  Problem Relation Age of Onset  . Hyperthyroidism Mother   . Hyperthyroidism Maternal Uncle     No Known Allergies  Outpatient Medications Prior to Visit   Medication Sig Dispense Refill  . amLODipine (NORVASC) 10 MG tablet Take 1 tablet (10 mg total) by mouth daily. 30 tablet 6  . hydrOXYzine (ATARAX/VISTARIL) 25 MG tablet Take 1 tablet (25 mg total) by mouth 2 (two) times daily as needed. 60 tablet 1  . ibuprofen (ADVIL) 800 MG tablet Take 1 tablet (800 mg total) by mouth every 12 (twelve) hours as needed. 60 tablet 1  . levothyroxine (SYNTHROID) 88 MCG tablet Take 1 tablet (88 mcg total) by mouth daily before breakfast. 30 tablet 6  . losartan-hydrochlorothiazide (HYZAAR) 100-25 MG tablet Take 1 tablet by mouth daily. 30 tablet 6  . methocarbamol (ROBAXIN) 500 MG tablet Take 1 tablet by mouth 2 (two) times daily as needed for muscle spasms.   3  . Multiple Vitamins-Minerals (MULTIVITAMIN ADULT PO) Take 1 tablet by mouth daily.    Marland Kitchen omeprazole (PRILOSEC) 20 MG capsule Take 1 capsule (20 mg total) by mouth daily. 20 capsule 0  . sildenafil (VIAGRA) 50 MG tablet Take 1 tablet (50 mg total) by mouth daily as needed for erectile dysfunction (at least 24 hours between doses). 10 tablet 0  . FLUoxetine (PROZAC) 20 MG tablet Take 1 tablet (20 mg total) by mouth daily. 30 tablet 3  . metoprolol tartrate (LOPRESSOR) 25 MG tablet Take 1 tablet (25 mg total) by mouth 2 (two) times daily. 180 tablet 3  . atorvastatin (LIPITOR) 20 MG tablet Take 1 tablet (  20 mg total) by mouth daily. (Patient not taking: Reported on 02/24/2019) 30 tablet 6  . famotidine (PEPCID) 20 MG tablet Take 1 tablet (20 mg total) by mouth 2 (two) times daily for 10 days. (Patient not taking: Reported on 03/03/2019) 20 tablet 0  . ondansetron (ZOFRAN-ODT) 4 MG disintegrating tablet Take 1 tablet (4 mg total) by mouth every 8 (eight) hours as needed for nausea or vomiting. (Patient not taking: Reported on 07/23/2018) 12 tablet 0  . topiramate (TOPAMAX) 100 MG tablet Take 1 tablet (100 mg total) by mouth 2 (two) times daily. (Patient not taking: Reported on 02/24/2019) 60 tablet 6   No  facility-administered medications prior to visit.      ROS Review of Systems  Constitutional: Negative for activity change and appetite change.  HENT: Negative for sinus pressure and sore throat.   Eyes: Negative for visual disturbance.  Respiratory: Negative for cough, chest tightness and shortness of breath.   Cardiovascular: Positive for chest pain. Negative for leg swelling.  Gastrointestinal: Negative for abdominal distention, abdominal pain, constipation and diarrhea.  Endocrine: Negative.   Genitourinary: Negative for dysuria.  Musculoskeletal: Negative for joint swelling and myalgias.  Skin: Negative for rash.  Allergic/Immunologic: Negative.   Neurological: Positive for tremors. Negative for weakness, light-headedness and numbness.  Psychiatric/Behavioral: Negative for dysphoric mood and suicidal ideas.       Positive for Anxiety    Objective:  BP (!) 143/82   Pulse 84   Ht 6' (1.829 m)   Wt 245 lb 9.6 oz (111.4 kg)   SpO2 96%   BMI 33.31 kg/m   BP/Weight 03/26/2019 03/03/2019 02/24/2019  Systolic BP 143 151 150  Diastolic BP 82 97 107  Wt. (Lbs) 245.6 249 -  BMI 33.31 33.77 -  Some encounter information is confidential and restricted. Go to Review Flowsheets activity to see all data.      Physical Exam Constitutional:      Appearance: He is well-developed.  Cardiovascular:     Rate and Rhythm: Normal rate.     Heart sounds: Normal heart sounds. No murmur.  Pulmonary:     Effort: Pulmonary effort is normal.     Breath sounds: Normal breath sounds. No wheezing or rales.  Chest:     Chest wall: No tenderness.  Abdominal:     General: Bowel sounds are normal. There is no distension.     Palpations: Abdomen is soft. There is no mass.     Tenderness: There is no abdominal tenderness.  Musculoskeletal: Normal range of motion.  Neurological:     Mental Status: He is alert and oriented to person, place, and time.  Psychiatric:     Comments: Anxious mood      CMP Latest Ref Rng & Units 02/24/2019 02/06/2019 07/17/2018  Glucose 70 - 99 mg/dL 161(W133(H) 960(A108(H) 80  BUN 6 - 20 mg/dL 12 10 12   Creatinine 0.61 - 1.24 mg/dL 5.400.96 9.810.94 1.910.90  Sodium 135 - 145 mmol/L 133(L) 138 138  Potassium 3.5 - 5.1 mmol/L 3.5 3.9 3.9  Chloride 98 - 111 mmol/L 97(L) 98 103  CO2 22 - 32 mmol/L 24 20 -  Calcium 8.9 - 10.3 mg/dL 9.5 47.810.1 -  Total Protein 6.5 - 8.1 g/dL 8.0 7.8 -  Total Bilirubin 0.3 - 1.2 mg/dL 1.2 0.8 -  Alkaline Phos 38 - 126 U/L 44 52 -  AST 15 - 41 U/L 29 26 -  ALT 0 - 44 U/L 39 26 -  Lipid Panel     Component Value Date/Time   CHOL 256 (H) 02/06/2019 1237   TRIG 100 02/06/2019 1237   HDL 70 02/06/2019 1237   CHOLHDL 3.7 02/06/2019 1237   LDLCALC 166 (H) 02/06/2019 1237    CBC    Component Value Date/Time   WBC 5.0 02/24/2019 1215   RBC 4.89 02/24/2019 1215   HGB 14.9 02/24/2019 1215   HCT 44.1 02/24/2019 1215   PLT 256 02/24/2019 1215   MCV 90.2 02/24/2019 1215   MCH 30.5 02/24/2019 1215   MCHC 33.8 02/24/2019 1215   RDW 12.8 02/24/2019 1215   LYMPHSABS 2.1 02/24/2019 1215   MONOABS 0.8 02/24/2019 1215   EOSABS 0.1 02/24/2019 1215   BASOSABS 0.0 02/24/2019 1215    No results found for: HGBA1C  Assessment & Plan:   1. Anxiety and depression Uncontrolled notes he has been noncompliant with medications; tremors and night sweats could be anxiety attacks LCSW called in for counseling and he will resume Prozac Commenced clonidine which will be helpful with regards to his night sweats and tremors I have provided a letter to his place of work to allow him 2 weeks off He is requesting Xanax-explained I will be unable to prescribe this for him and I have referred him to psychiatry services After shared decision making and offering him other choices of SSRI, he promises to resume Prozac - FLUoxetine (PROZAC) 20 MG tablet; Take 1 tablet (20 mg total) by mouth daily.  Dispense: 30 tablet; Refill: 3  2. Essential hypertension Slightly  elevated above goal but improved Discontinued metoprolol as per request Weight loss and lifestyle changes have been beneficial Counseled on blood pressure goal of less than 130/80, low-sodium, DASH diet, medication compliance, 150 minutes of moderate intensity exercise per week. Discussed medication compliance, adverse effects.  3. Hypothyroidism due to acquired atrophy of thyroid He does have tremors We will check thyroid panel - T4, free - TSH    Meds ordered this encounter  Medications  . FLUoxetine (PROZAC) 20 MG tablet    Sig: Take 1 tablet (20 mg total) by mouth daily.    Dispense:  30 tablet    Refill:  3  . cloNIDine (CATAPRES) 0.1 MG tablet    Sig: Take 1 tablet (0.1 mg total) by mouth at bedtime. For night sweats and tremors    Dispense:  30 tablet    Refill:  3    Follow-up: Keep previously scheduled appointment      Hoy RegisterEnobong Cayden Rautio, MD, FAAFP. [facility] [place], [place] [phone]   03/26/2019, 1:07 PM

## 2019-03-26 NOTE — Progress Notes (Signed)
Patient states that he is having chest pains and thinks its due to BP medications.(amlodipine)  Patient states that he is having night sweats and trembling in hands.

## 2019-03-27 ENCOUNTER — Telehealth: Payer: Self-pay | Admitting: Family Medicine

## 2019-03-27 LAB — T4, FREE: Free T4: 1.22 ng/dL (ref 0.82–1.77)

## 2019-03-27 LAB — TSH: TSH: 2.68 u[IU]/mL (ref 0.450–4.500)

## 2019-03-27 NOTE — Telephone Encounter (Signed)
Pt request return call for lab results from yesterday 03/26/19.

## 2019-03-27 NOTE — BH Specialist Note (Signed)
Integrated Behavioral Health Initial Visit  MRN: [id number] Name: Darren Barajas  Number of [facility] Clinician visits:: 1/6 Session Start time: 11:35 AM  Session End time: 12:00 PM Total time: 25 minutes  Type of Service: [place] Interpretor:No. Interpretor Name and Language: N/A   Warm Hand Off Completed.       SUBJECTIVE: Darren Barajas is a 33 y.o. male accompanied by self Patient was referred by Dr. Margarita Rana for anxiety and depression. Patient reports the following symptoms/concerns: Pt reports an increase in anxiety symptoms triggered by stress accompanied by working during the pandemic. He has chronic medical conditions that he is managing Duration of problem: 1 month; Severity of problem: moderate  OBJECTIVE: Mood: Anxious and Affect: Appropriate Risk of harm to self or others: No plan to harm self or others   STRENGTHS: Has good insight  LIFE CONTEXT: Family and Social: Pt reports receiving support from girlfriend and mother School/Work: Pt is employed. He plans on obtaining health insurance through employer Self-Care: Pt is open to medication management. He has implemented a healthier lifestyle (walking, diet, taking vitamins, etc) Life Changes: Pt reports an increase in anxiety symptoms triggered by stress accompanied by working during the pandemic. He has chronic medical conditions that he is managing  GOALS ADDRESSED: Patient will: 1. Reduce symptoms of: agitation, anxiety and stress 2. Increase knowledge and/or ability of: coping skills and healthy habits  3. Demonstrate ability to: Increase healthy adjustment to current life circumstances and Improve medication compliance  INTERVENTIONS: Interventions utilized: Solution-Focused Strategies, Supportive Counseling and Psychoeducation and/or Health Education  Standardized Assessments completed: GAD-7 and PHQ 2&9  ASSESSMENT: Patient currently  experiencing anxiety triggered by stress accompanied by working during the pandemic. He has chronic medical conditions that he is managing chronic medical conditions. Pt reports having a support system. He denies suicidal/homicidal ideations.    Patient may benefit from psychotherapy. He is participating in medication management through PCP; however, prefers Xanax. LCSW discussed therapeutic strategies to assist in decrease/management of symptoms. Resources for psychiatry and psychotherapy provided.   PLAN: 1. Follow up with behavioral health clinician on : Schedule follow up appointment if symptoms increase/worsen 2. Behavioral recommendations: Utilize strategies provided, comply with medication management, and initiate psychiatry services 3. Referral(s): [place] (In Clinic) and Psychiatrist 4. "From scale of 1-10, how likely are you to follow plan?":   Rebekah Chesterfield, LCSW 03/27/2019 4:49 PM

## 2019-03-27 NOTE — Telephone Encounter (Signed)
Patient name and DOB has been verified Patient was informed of lab results. Patient had no questions.  

## 2019-03-27 NOTE — Telephone Encounter (Signed)
-----   Message from Charlott Rakes, MD sent at 03/27/2019  2:07 PM EDT ----- Please inform him thyroid labs are normal no change in his levothyroxine dose needs to be made.

## 2019-03-31 MED FILL — LEVOTHYROXINE 88 MCG TABLET: 88 | 30 days supply | Qty: 30 | Fill #1

## 2019-03-31 MED FILL — LOSARTAN-HCTZ 100-25 MG TAB: 100-25 | 30 days supply | Qty: 30 | Fill #3

## 2019-04-03 MED FILL — IBUPROFEN 800 MG TABLET: 800 | 30 days supply | Qty: 60 | Fill #0

## 2019-04-21 MED FILL — ?AMLODIPINE BESYLATE 10 MG: 10 | 30 days supply | Qty: 30 | Fill #1

## 2019-05-02 MED FILL — LEVOTHYROXINE 88 MCG TABLET: 88 | 30 days supply | Qty: 30 | Fill #2

## 2019-05-26 MED FILL — ?AMLODIPINE BESYLATE 10 MG: 10 | 30 days supply | Qty: 30 | Fill #2

## 2019-06-10 MED FILL — LEVOTHYROXINE 88 MCG TABLET: 88 | 30 days supply | Qty: 30 | Fill #3

## 2019-06-10 MED FILL — ?IBUPROFEN 800 MG TABS: AMNEAL | 30 days supply | Qty: 60 | Fill #0

## 2019-06-10 MED FILL — LOSARTAN-HCTZ 100-25 MG TAB: 100-25 | 30 days supply | Qty: 30 | Fill #4

## 2019-07-15 MED FILL — LOSARTAN-HCTZ 100-25 MG TAB: 100-25 | 30 days supply | Qty: 30 | Fill #5

## 2019-07-15 MED FILL — ?AMLODIPINE BESYLATE 10 MG: 10 | 30 days supply | Qty: 30 | Fill #3

## 2019-07-15 MED FILL — LEVOTHYROXINE 88 MCG TABLET: 88 | 30 days supply | Qty: 30 | Fill #4

## 2019-08-15 MED FILL — LEVOTHYROXINE 88 MCG TABLET: 88 | 30 days supply | Qty: 30 | Fill #5

## 2019-08-15 MED FILL — LOSARTAN-HCTZ 100-25 MG TAB: 100-25 | 30 days supply | Qty: 30 | Fill #6

## 2019-09-09 MED FILL — ?AMLODIPINE BESYLATE 10 MG: 10 | 30 days supply | Qty: 30 | Fill #4

## 2019-09-15 MED FILL — LOSARTAN-HCTZ 100-25 MG TAB: 100-25 | 30 days supply | Qty: 30 | Fill #0

## 2019-09-15 MED FILL — ?IBUPROFEN 800 MG TABS: AMNEAL | 30 days supply | Qty: 60 | Fill #1

## 2019-10-07 MED FILL — LEVOTHYROXINE 88 MCG TABLET: 88 | 30 days supply | Qty: 30 | Fill #6

## 2019-10-27 MED FILL — ?AMLODIPINE BESYL 10MG TABL: 10 | 30 days supply | Qty: 30 | Fill #5

## 2019-10-27 MED FILL — LOSARTAN-HCTZ 100-25 MG TAB: 100-25 | 30 days supply | Qty: 30 | Fill #1

## 2019-11-18 ENCOUNTER — Other Ambulatory Visit: Payer: Self-pay | Admitting: Family Medicine

## 2019-11-18 DIAGNOSIS — E034 Atrophy of thyroid (acquired): Secondary | ICD-10-CM

## 2019-11-18 MED FILL — LEVOTHYROXINE 88 MCG TABLET: 88 | 30 days supply | Qty: 30 | Fill #0

## 2019-12-02 MED FILL — AMLODIPINE BESYLATE 10 MG T: 10 | 30 days supply | Qty: 30 | Fill #6

## 2019-12-11 MED FILL — LOSARTAN-HCTZ 100-25 MG TAB: 100-25 | 30 days supply | Qty: 30 | Fill #2

## 2019-12-31 ENCOUNTER — Other Ambulatory Visit: Payer: Self-pay | Admitting: Family Medicine

## 2019-12-31 DIAGNOSIS — E034 Atrophy of thyroid (acquired): Secondary | ICD-10-CM

## 2020-01-05 ENCOUNTER — Other Ambulatory Visit: Payer: Self-pay | Admitting: Family Medicine

## 2020-01-05 ENCOUNTER — Ambulatory Visit: Payer: PRIVATE HEALTH INSURANCE | Attending: Family Medicine | Admitting: Family Medicine

## 2020-01-05 ENCOUNTER — Other Ambulatory Visit: Payer: Self-pay

## 2020-01-05 ENCOUNTER — Encounter: Payer: Self-pay | Admitting: Family Medicine

## 2020-01-05 VITALS — BP 144/88 | HR 100 | Ht 72.0 in | Wt 260.0 lb

## 2020-01-05 DIAGNOSIS — E7800 Pure hypercholesterolemia, unspecified: Secondary | ICD-10-CM

## 2020-01-05 DIAGNOSIS — Z720 Tobacco use: Secondary | ICD-10-CM

## 2020-01-05 DIAGNOSIS — Z23 Encounter for immunization: Secondary | ICD-10-CM

## 2020-01-05 DIAGNOSIS — I10 Essential (primary) hypertension: Secondary | ICD-10-CM

## 2020-01-05 DIAGNOSIS — R29818 Other symptoms and signs involving the nervous system: Secondary | ICD-10-CM

## 2020-01-05 DIAGNOSIS — E034 Atrophy of thyroid (acquired): Secondary | ICD-10-CM

## 2020-01-05 MED ORDER — OLMESARTAN-AMLODIPINE-HCTZ 40-10-25 MG PO TABS: 1.0000 | ORAL_TABLET | Freq: Every day | ORAL | 6 refills | Status: DC

## 2020-01-05 MED ORDER — ATORVASTATIN CALCIUM 20 MG PO TABS: 20.0000 mg | ORAL_TABLET | Freq: Every day | ORAL | 6 refills | Status: DC

## 2020-01-05 MED ORDER — LEVOTHYROXINE SODIUM 88 MCG PO TABS: 88.0000 ug | ORAL_TABLET | Freq: Every day | ORAL | 3 refills | Status: DC

## 2020-01-05 MED FILL — OLMSRTN-AMLDPN-HCTZ 40-10-2: 40-10-25 | 30 days supply | Qty: 30 | Fill #0

## 2020-01-05 MED FILL — LEVOTHYROXINE 88 MCG TABLET: 88 | 30 days supply | Qty: 30 | Fill #0

## 2020-01-05 MED FILL — ?ATORVASTATIN 20 MG TABLET: 20 | 30 days supply | Qty: 30 | Fill #0

## 2020-01-05 NOTE — Patient Instructions (Signed)
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
## 2020-01-05 NOTE — Progress Notes (Signed)
Needs refills on medications. 

## 2020-01-06 LAB — BASIC METABOLIC PANEL
BUN/Creatinine Ratio: 9 (ref 9–20)
BUN: 9 mg/dL (ref 6–20)
CO2: 25 mmol/L (ref 20–29)
Calcium: 9.9 mg/dL (ref 8.7–10.2)
Chloride: 97 mmol/L (ref 96–106)
Creatinine, Ser: 0.96 mg/dL (ref 0.76–1.27)
GFR calc Af Amer: 120 mL/min/{1.73_m2} (ref >59)
GFR calc non Af Amer: 104 mL/min/{1.73_m2} (ref >59)
Glucose: 89 mg/dL (ref 65–99)
Potassium: 3.6 mmol/L (ref 3.5–5.2)
Sodium: 135 mmol/L (ref 134–144)

## 2020-01-06 LAB — TSH: TSH: 7.85 u[IU]/mL — ABNORMAL HIGH (ref 0.450–4.500)

## 2020-01-06 LAB — T4, FREE: Free T4: 1.02 ng/dL (ref 0.82–1.77)

## 2020-01-09 NOTE — Progress Notes (Signed)
Patient has seen the results via mychart

## 2020-01-23 ENCOUNTER — Other Ambulatory Visit (HOSPITAL_COMMUNITY)
Admission: RE | Admit: 2020-01-23 | Discharge: 2020-01-23 | Disposition: A | Discharge status: Home / Self Care | Payer: PRIVATE HEALTH INSURANCE | Source: Ambulatory Visit | Attending: Internal Medicine | Admitting: Internal Medicine

## 2020-01-23 DIAGNOSIS — Z20822 Contact with and (suspected) exposure to covid-19: Secondary | ICD-10-CM | POA: Insufficient documentation

## 2020-01-23 DIAGNOSIS — Z01812 Encounter for preprocedural laboratory examination: Secondary | ICD-10-CM | POA: Insufficient documentation

## 2020-01-23 LAB — SARS CORONAVIRUS 2 (TAT 6-24 HRS): SARS Coronavirus 2: NEGATIVE

## 2020-01-25 ENCOUNTER — Ambulatory Visit (HOSPITAL_BASED_OUTPATIENT_CLINIC_OR_DEPARTMENT_OTHER): Payer: Self-pay | Attending: Family Medicine | Admitting: Internal Medicine

## 2020-01-25 ENCOUNTER — Other Ambulatory Visit: Payer: Self-pay

## 2020-01-25 VITALS — Ht 72.0 in | Wt 258.0 lb

## 2020-01-25 DIAGNOSIS — R29818 Other symptoms and signs involving the nervous system: Secondary | ICD-10-CM | POA: Insufficient documentation

## 2020-01-25 DIAGNOSIS — G4733 Obstructive sleep apnea (adult) (pediatric): Secondary | ICD-10-CM

## 2020-01-31 NOTE — Procedures (Signed)
Patient Name: Darren Barajas, Darren Barajas Date: 01/25/2020 Gender: Male D.O.B: 04-20-1986 Age (years): 34 Referring Provider: Arnoldo Morale Height (inches): 70 Interpreting Physician: Baird Lyons MD, ABSM Weight (lbs): 258 RPSGT: Baxter Flattery BMI: 37 MRN: [id number] Neck Size: 19.00  CLINICAL INFORMATION Sleep Study Type: NPSG Indication for sleep study: Fatigue, Hypertension, Obesity, Snoring, Witnesses Apnea / Gasping During Sleep Epworth Sleepiness Score: 5  SLEEP STUDY TECHNIQUE As per the AASM Manual for the Scoring of Sleep and Associated Events v2.3 (April 2016) with a hypopnea requiring 4% desaturations.  The channels recorded and monitored were frontal, central and occipital EEG, electrooculogram (EOG), submentalis EMG (chin), nasal and oral airflow, thoracic and abdominal wall motion, anterior tibialis EMG, snore microphone, electrocardiogram, and pulse oximetry.  MEDICATIONS Medications self-administered by patient taken the night of the study : Patient reported Xanax taken at 02:20AM.  SLEEP ARCHITECTURE The study was initiated at 10:10:39 PM and ended at 4:30:58 AM.  Sleep onset time was 90.6 minutes and the sleep efficiency was 38.8%%. The total sleep time was 147.7 minutes.  Stage REM latency was 106.0 minutes.  The patient spent 5.1%% of the night in stage N1 sleep, 78.3%% in stage N2 sleep, 0.0%% in stage N3 and 16.6% in REM.  Alpha intrusion was absent.  Supine sleep was 90.36%.  RESPIRATORY PARAMETERS The overall apnea/hypopnea index (AHI) was 97.9 per hour. There were 237 total apneas, including 218 obstructive, 0 central and 19 mixed apneas. There were 4 hypopneas and 0 RERAs.  The AHI during Stage REM sleep was 78.4 per hour.  AHI while supine was 93.9 per hour.  The mean oxygen saturation was 84.6%. The minimum SpO2 during sleep was 57.0%.  loud snoring was noted during this study.  CARDIAC DATA The 2 lead EKG demonstrated sinus rhythm. The  mean heart rate was 60.4 beats per minute. Other EKG findings include: PVCs.  LEG MOVEMENT DATA The total PLMS were 0 with a resulting PLMS index of 0.0. Associated arousal with leg movement index was 0.0 .  IMPRESSIONS - Severe obstructive sleep apnea occurred during this study (AHI = 97.9/h). - Patient declined to wear CPAP on this study night. - No significant central sleep apnea occurred during this study (CAI = 0.0/h). - Severe oxygen desaturation was noted during this study (Min O2 = 57.0%). Mean O2 sat 84.6%. - The patient snored with loud snoring volume. - EKG findings include PVCs. - Clinically significant periodic limb movements did not occur during sleep. No significant associated arousals.  DIAGNOSIS - Obstructive Sleep Apnea (327.23 [G47.33 ICD-10]) - Nocturnal Hypoxemia (327.26 [G47.36 ICD-10])  RECOMMENDATIONS - Recommend return for CPAP titration sleep study, allowing time for education and CPAP desensitization. Ok for patient to bring a sedative hypnotic for this study if necessary to help him with CPAP acceptance.  - CPAP is almost always the preferred treatment for severe obstructive apnea scores in this range. ENT evaluation for surgical alternatives and long term weight loss may be considerations. - Sedatives should be avoided until obstructive apnea is treated. - Sleep hygiene should be reviewed to assess factors that may improve sleep quality. - Weight management and regular exercise should be initiated or continued if appropriate.  [Electronically signed] 01/31/2020 11:34 AM  Baird Lyons MD, ABSM Diplomate, American Board of Sleep Medicine   NPI: [id number]                          [place], American Board of Sleep  Medicine  ELECTRONICALLY SIGNED ON:  01/31/2020, 11:23 AM [facility] PH: [phone]   FX: [phone] ACCREDITED BY THE AMERICAN ACADEMY OF SLEEP MEDICINE

## 2020-02-01 ENCOUNTER — Other Ambulatory Visit: Payer: Self-pay | Admitting: Internal Medicine

## 2020-02-01 DIAGNOSIS — G4733 Obstructive sleep apnea (adult) (pediatric): Secondary | ICD-10-CM

## 2020-02-01 NOTE — Progress Notes (Signed)
Let pt know that sleep study reveals that he has severe obstructive sleep apnea with drop in oxygen levels during sleep.  He needs to have the second part of the study for CPAP titration. Order placed.

## 2020-02-02 ENCOUNTER — Telehealth: Payer: Self-pay

## 2020-02-02 NOTE — Telephone Encounter (Signed)
-----   Message from Marcine Matar, MD sent at 02/01/2020 10:00 AM EDT ----- Let pt know that sleep study reveals that he has severe obstructive sleep apnea with drop in oxygen levels during sleep.  He needs to have the second part of the study for CPAP titration. Order placed.

## 2020-02-02 NOTE — Telephone Encounter (Signed)
Patient name and DOB has been verified Patient was informed of lab results. Patient had no questions.  

## 2020-02-10 MED FILL — OLMSRTN-AMLDPN-HCTZ 40-10-2: 40-10-25 | 30 days supply | Qty: 30 | Fill #1

## 2020-02-10 MED FILL — LEVOTHYROXINE 88 MCG TABLET: 88 | 30 days supply | Qty: 30 | Fill #1

## 2020-02-16 ENCOUNTER — Inpatient Hospital Stay (HOSPITAL_COMMUNITY): Admission: RE | Admit: 2020-02-16 | Payer: PRIVATE HEALTH INSURANCE | Source: Ambulatory Visit

## 2020-02-17 ENCOUNTER — Other Ambulatory Visit (HOSPITAL_COMMUNITY)
Admission: RE | Admit: 2020-02-17 | Discharge: 2020-02-17 | Disposition: A | Discharge status: Home / Self Care | Payer: HRSA Program | Source: Ambulatory Visit | Attending: Internal Medicine | Admitting: Internal Medicine

## 2020-02-17 DIAGNOSIS — Z01812 Encounter for preprocedural laboratory examination: Secondary | ICD-10-CM | POA: Diagnosis present

## 2020-02-17 DIAGNOSIS — Z20822 Contact with and (suspected) exposure to covid-19: Secondary | ICD-10-CM | POA: Insufficient documentation

## 2020-02-17 LAB — SARS CORONAVIRUS 2 (TAT 6-24 HRS): SARS Coronavirus 2: NEGATIVE

## 2020-02-18 ENCOUNTER — Ambulatory Visit (HOSPITAL_BASED_OUTPATIENT_CLINIC_OR_DEPARTMENT_OTHER): Payer: PRIVATE HEALTH INSURANCE | Attending: Internal Medicine | Admitting: Internal Medicine

## 2020-02-18 ENCOUNTER — Other Ambulatory Visit: Payer: Self-pay

## 2020-02-18 VITALS — Ht 72.0 in | Wt 258.0 lb

## 2020-02-18 DIAGNOSIS — G4733 Obstructive sleep apnea (adult) (pediatric): Secondary | ICD-10-CM | POA: Insufficient documentation

## 2020-02-18 DIAGNOSIS — R0902 Hypoxemia: Secondary | ICD-10-CM | POA: Insufficient documentation

## 2020-02-18 DIAGNOSIS — Z79899 Other long term (current) drug therapy: Secondary | ICD-10-CM | POA: Insufficient documentation

## 2020-02-24 IMAGING — DX PORTABLE CHEST - 1 VIEW
1 series · 1 of 1 positions shown · non-contrast
Comparison: 03/17/2017.

CLINICAL DATA: Chest pressure.

EXAM:
PORTABLE CHEST 1 VIEW

[chest ap]
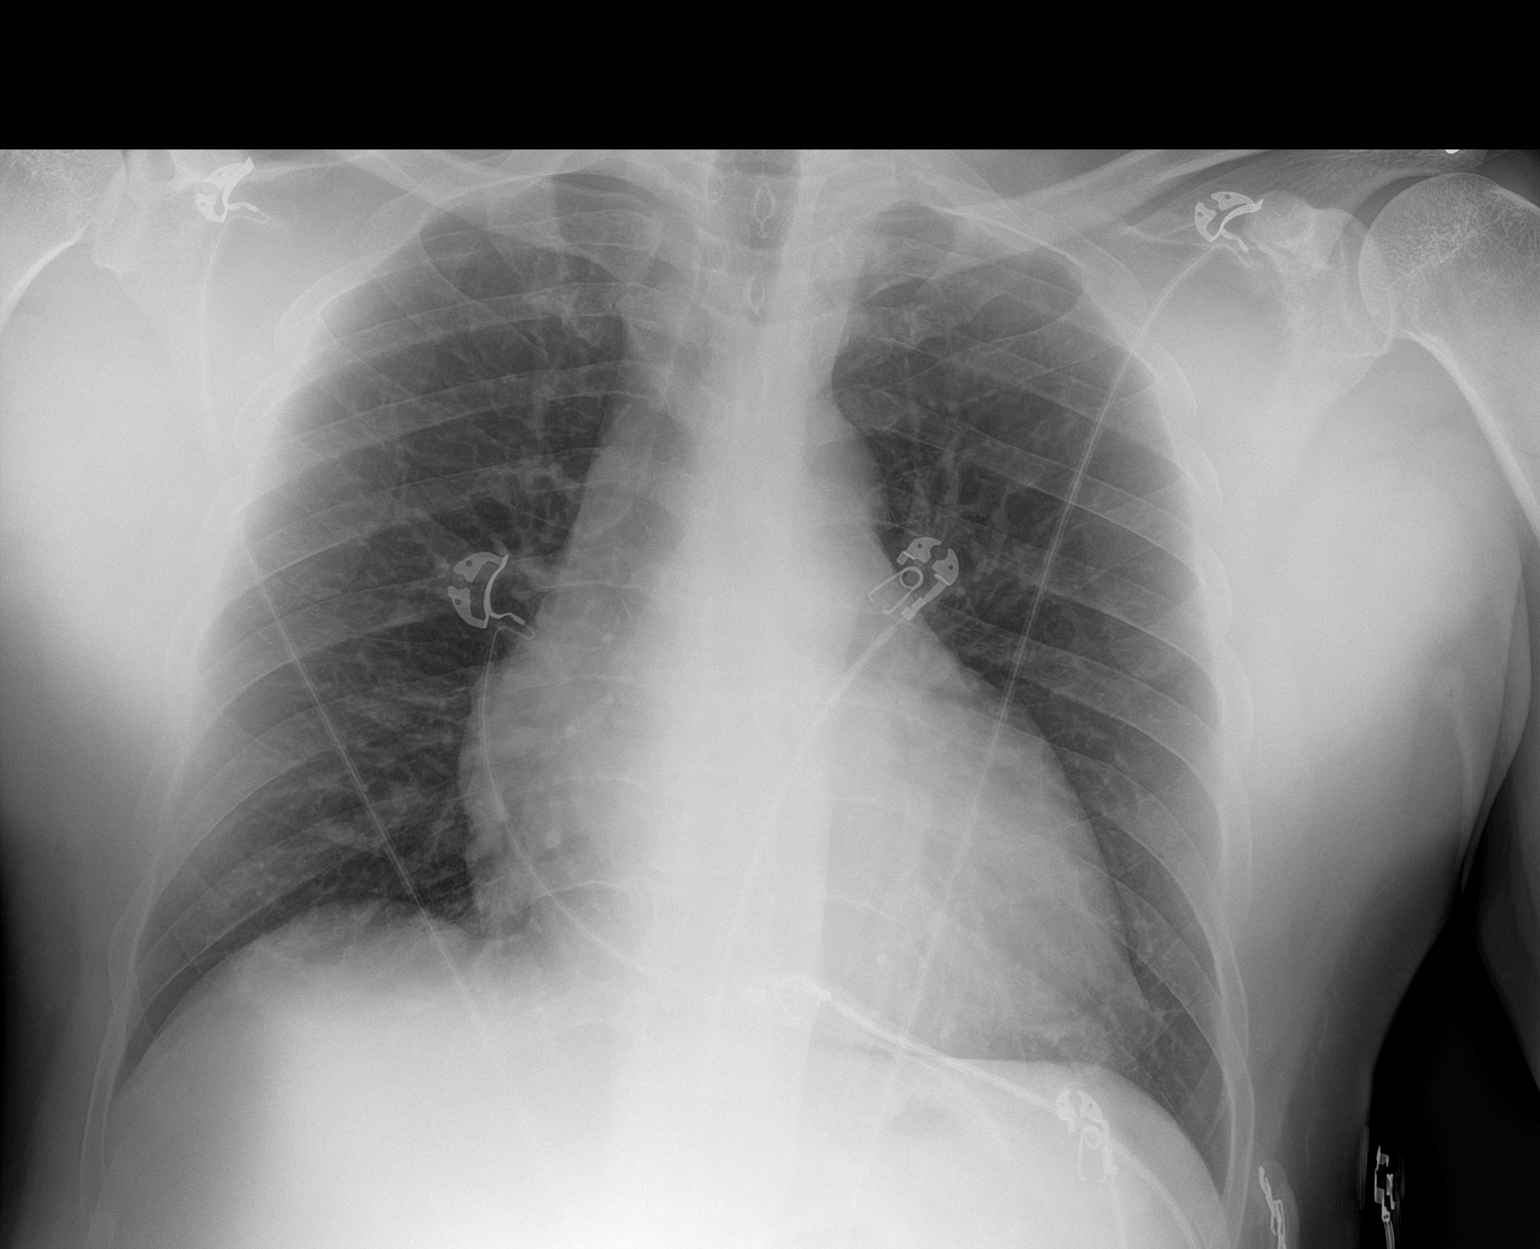

[1 of 1 positions shown; findings below may reference images not displayed]

FINDINGS: Mediastinum and hilar structures are normal. Cardiomegaly with
normal pulmonary vascularity. No focal infiltrate. No pleural
effusion or pneumothorax. No acute bony abnormality
IMPRESSION: 1.  Cardiomegaly.  No pulmonary venous congestion.

2.  No acute abnormality.

## 2020-02-29 DIAGNOSIS — G4733 Obstructive sleep apnea (adult) (pediatric): Secondary | ICD-10-CM

## 2020-02-29 NOTE — Procedures (Signed)
Patient Name: Darren Barajas, Darren Barajas Date: 02/18/2020 Gender: Male D.O.B: 1986/01/23 Age (years): 34 Referring Provider: Jaclyn Shaggy Height (inches): 70 Interpreting Physician: Jetty Duhamel MD, ABSM Weight (lbs): 258 RPSGT: Shelah Lewandowsky BMI: 37 MRN: [id number] Neck Size: 19.00  CLINICAL INFORMATION The patient is referred for a CPAP titration to treat sleep apnea.  Date of NPSG, Split Night or HST: NPSG 01/25/20 AHI 97.9/ hr, desaturation to 57%, body weight 258 lbs  SLEEP STUDY TECHNIQUE As per the AASM Manual for the Scoring of Sleep and Associated Events v2.3 (April 2016) with a hypopnea requiring 4% desaturations.  The channels recorded and monitored were frontal, central and occipital EEG, electrooculogram (EOG), submentalis EMG (chin), nasal and oral airflow, thoracic and abdominal wall motion, anterior tibialis EMG, snore microphone, electrocardiogram, and pulse oximetry. Continuous positive airway pressure (CPAP) was initiated at the beginning of the study and titrated to treat sleep-disordered breathing.  MEDICATIONS Medications self-administered by patient taken the night of the study : XANAX  TECHNICIAN COMMENTS Comments added by technician: NONE Comments added by scorer: N/A RESPIRATORY PARAMETERS Optimal PAP Pressure (cm): 19 AHI at Optimal Pressure (/hr): 12.0 Overall Minimal O2 (%): 0.0 Supine % at Optimal Pressure (%): 41 Minimal O2 at Optimal Pressure (%): 0.0   SLEEP ARCHITECTURE The study was initiated at 10:54:48 PM and ended at 5:19:52 AM.  Sleep onset time was 4.4 minutes and the sleep efficiency was 77.5%%. The total sleep time was 298.5 minutes.  The patient spent 7.5%% of the night in stage N1 sleep, 61.1%% in stage N2 sleep, 0.0%% in stage N3 and 31.3% in REM.Stage REM latency was 74.5 minutes  Wake after sleep onset was 82.1. Alpha intrusion was absent. Supine sleep was 50.92%.  CARDIAC DATA The 2 lead EKG demonstrated sinus rhythm. The  mean heart rate was 66.0 beats per minute. Other EKG findings include: None.  LEG MOVEMENT DATA The total Periodic Limb Movements of Sleep (PLMS) were 0. The PLMS index was 0.0. A PLMS index of <15 is considered normal in adults.  IMPRESSIONS - The optimal PAP pressure was 19 cm of water. - Central sleep apnea was not noted during this titration (CAI = 1.4/h). - Severe oxygen desaturation was noted durng titration to a nadir of 79%. Minimum saturation on CPAP 19 was 94%. - The patient snored with loud snoring volume during this titration study. - No cardiac abnormalities were observed during this study. - Clinically significant periodic limb movements were not noted during this study. Arousals associated with PLMs were rare.  DIAGNOSIS - Obstructive Sleep Apnea (327.23 [G47.33 ICD-10]) - Nocturnal Hypoxemia (327.26 [G47.36 ICD-10])  RECOMMENDATIONS - Trial of CPAP therapy on 19 cm H2O or autopap 15-20. - Patient wore a Medium Wide size Philips Respironics Full Face Mask Dreamwear mask and heated humidification. - Oxygen desaturation was prevented by final CPAP pressure. Future weight gain might result in need for supplemental O2. - Be careful with alcohol, sedatives and other CNS depressants that may worsen sleep apnea and disrupt normal sleep architecture. - Sleep hygiene should be reviewed to assess factors that may improve sleep quality. - Weight management and regular exercise should be initiated or continued.  [Electronically signed] 02/29/2020 10:05 AM  Jetty Duhamel MD, ABSM Diplomate, American Board of Sleep Medicine   NPI: [id number]                          Jetty Duhamel Diplomate, American Board of Sleep Medicine  ELECTRONICALLY  SIGNED ON:  02/29/2020, 10:04 AM [place] PH: [phone]   FX: [phone] [employer]

## 2020-03-01 ENCOUNTER — Other Ambulatory Visit: Payer: Self-pay | Admitting: Family Medicine

## 2020-03-01 ENCOUNTER — Telehealth: Payer: Self-pay

## 2020-03-01 MED ORDER — MISC. DEVICES MISC
0 refills | Status: AC
Start: 2020-03-01 — End: ?

## 2020-03-01 NOTE — Telephone Encounter (Signed)
Call placed to patient and discussed results of sleep study and sleep hygiene.  He said that he has been exercising, trying to eat healthier and is cutting down on his smoking and he is feeling a lot better.   he is currently uninsured.    Explained to him that the order for the CPAP can be sent to [facility] to see if he qualifies for their hardship program.  There is no guarantee that he will be approved or if any assistance with the cost of the machine can be provided. He was agreeable to sending the order to [facility] medical supply.  Provided him with the phone number for the company and instructed him to call them in 1-2 days and ask for [facility].  He will need to complete the hardship application.   He said that if he is not approved for the hardship program and is unable to afford the machine from [facility], he will look into obtaining one on-line.   Referral faxed to [facility] medical Supply

## 2020-03-08 ENCOUNTER — Telehealth: Payer: Self-pay

## 2020-03-08 MED FILL — OLMSRTN-AMLDPN-HCTZ 40-10-2: 40-10-25 | 30 days supply | Qty: 30 | Fill #2

## 2020-03-08 MED FILL — LEVOTHYROXINE 88 MCG TABLET: 88 | 30 days supply | Qty: 30 | Fill #2

## 2020-03-08 NOTE — Telephone Encounter (Signed)
Pt request assistance. Pt states purchased cpap machine & needs settings for the machine. Pt states sleep study results tell what setting he should use.

## 2020-03-09 NOTE — Telephone Encounter (Signed)
Pt request return call from [place]. Confirmed ph# is correct.

## 2020-03-09 NOTE — Telephone Encounter (Signed)
Call received from patient. He said that [facility] called him about the CPAP but he decided to purchase one on his own. When asked if he has a CPAP or Autopap, he said that he has a Resmed auto and wanted to know what the settings are for the machine.  He was not having any problems with it.  Informed him that as per his sleep study which he can view in MyChart,the autopap range is 15-20. Explained to him that this office does not set up CPAP machines and this CM could speak to the pulmonologist tomorrow. He said that he did not need to help with it, he only needed the settings

## 2020-03-09 NOTE — Telephone Encounter (Signed)
Patient called in and requested his for cpap settings. Patient stated that he purchased a cpap machine online and was wanting to set it up. Please follow up at your earliest convenience.

## 2020-03-09 NOTE — Telephone Encounter (Signed)
Call returned to the patient regarding the CPAP. Reminded him that the settings are available in the sleep study report that he can view in My Chart. He said that he is anxious to have a good night sleep.  Explained to him that the report has settings for CPAP and autopap.  He said that he has [employer]med autoset.  Explained to him that he can call this CM tomorrow when the pulmonologist will be in the office if he has any questions about the machine and this CM will try to assist with answering any questions.  We can also schedule an appointment for him with Dr Delford Field.   He then said that he needs to schedule an appointment with Dr Alvis Lemmings.  He explained that he has been having chest pains but none at the current time. He said that it is a " squeezing pain that can be in his chest at his lower rib cage, sometimes in  his thighs. He denied any shortness of breath with these episodes. He said that it is very intermittent and may last 15 minutes.  Does not happen daily he said that it occurs mostly when he has been drinking a lot of alcohol.  He noted that this can cause anxiety. He spoke about his anxiety and said that he understands the negative effects of alcohol. Explained to him that [facility] has an LCSW who he may want to speak with to discuss his anxiety and alcohol use.    Reviewed with him when to seek immediate medical attention in the ED for chest pain and he said that he did not need to go to the hospital, he was not having chest pain now.

## 2020-03-10 NOTE — Telephone Encounter (Signed)
I will see him at his office visit.  For chest pains he needs to go to the ED.

## 2020-03-22 ENCOUNTER — Encounter: Payer: Self-pay | Admitting: Family Medicine

## 2020-03-22 ENCOUNTER — Other Ambulatory Visit: Payer: Self-pay

## 2020-03-22 ENCOUNTER — Ambulatory Visit: Payer: Self-pay | Attending: Family Medicine | Admitting: Family Medicine

## 2020-03-22 VITALS — BP 139/88 | HR 83 | Ht 72.0 in | Wt 259.2 lb

## 2020-03-22 DIAGNOSIS — E034 Atrophy of thyroid (acquired): Secondary | ICD-10-CM

## 2020-03-22 DIAGNOSIS — Z7289 Other problems related to lifestyle: Secondary | ICD-10-CM

## 2020-03-22 DIAGNOSIS — Z1159 Encounter for screening for other viral diseases: Secondary | ICD-10-CM

## 2020-03-22 DIAGNOSIS — K219 Gastro-esophageal reflux disease without esophagitis: Secondary | ICD-10-CM

## 2020-03-22 DIAGNOSIS — E66812 Obesity, class 2: Secondary | ICD-10-CM

## 2020-03-22 DIAGNOSIS — Z789 Other specified health status: Secondary | ICD-10-CM

## 2020-03-22 DIAGNOSIS — Z6835 Body mass index (BMI) 35.0-35.9, adult: Secondary | ICD-10-CM

## 2020-03-22 DIAGNOSIS — F1090 Alcohol use, unspecified, uncomplicated: Secondary | ICD-10-CM

## 2020-03-22 DIAGNOSIS — E7800 Pure hypercholesterolemia, unspecified: Secondary | ICD-10-CM

## 2020-03-22 DIAGNOSIS — G4733 Obstructive sleep apnea (adult) (pediatric): Secondary | ICD-10-CM

## 2020-03-22 DIAGNOSIS — I10 Essential (primary) hypertension: Secondary | ICD-10-CM

## 2020-03-22 DIAGNOSIS — Z720 Tobacco use: Secondary | ICD-10-CM

## 2020-03-22 DIAGNOSIS — E6609 Other obesity due to excess calories: Secondary | ICD-10-CM

## 2020-03-22 MED ORDER — ATORVASTATIN CALCIUM 20 MG PO TABS: 20.0000 mg | ORAL_TABLET | Freq: Every day | ORAL | 6 refills | Status: DC

## 2020-03-22 MED ORDER — OMEPRAZOLE 20 MG PO CPDR: 20.0000 mg | DELAYED_RELEASE_CAPSULE | Freq: Every day | ORAL | 6 refills | Status: DC

## 2020-03-22 MED FILL — ATORVASTATIN CALCIUM 20 MG: 20 | 30 days supply | Qty: 30 | Fill #0

## 2020-03-22 MED FILL — OMEPRAZOLE 20 MG CAP: 20 | 30 days supply | Qty: 30 | Fill #0

## 2020-03-22 NOTE — Patient Instructions (Signed)

## 2020-03-22 NOTE — Progress Notes (Signed)
Subjective:  Patient ID: Darren Barajas, male    DOB: 09-23-1985  Age: 34 y.o. MRN: [id number]  CC: Hypertension   HPI Darren Barajas is a 34 year old male with a history of migraines, OSA (on CPAP), hypertension, hyperlipidemia, hypothyroidism seen for follow-up visit.  Taking Lipitor every other day as 'he felt bad' when he took it every day Compliant with his levothyroxine his last thyroid panel was normal.  Doing well on his antihypertensive He bought a CPAP machine (due to lack of insurance) and states it works well;  he got a new mask and has noticed some air leakage though but he sleeps well. He started drinking again, about a bottle/day and his side hurts and his chest as well whenever he drinks.  He is asymptomatic at this time. He is slowing down with regards to his cigarettes as to go outside to smoke since he has a 33 month old baby.  Past Medical History:  Diagnosis Date  . Anginal pain ([place])   . Chronic bronchitis ([place])    "get it q couple months; q couple weeks" (04/07/2015)  . Graves' disease    treated w/radioactive ioding ablation in 2001  . Headache    "weekly" (04/07/2015)  . Hypertension    Not currently taking medications    Past Surgical History:  Procedure Laterality Date  . NO PAST SURGERIES      Family History  Problem Relation Age of Onset  . Hyperthyroidism Mother   . Hyperthyroidism Maternal Uncle     No Known Allergies  Outpatient Medications Prior to Visit  Medication Sig Dispense Refill  . ibuprofen (ADVIL) 800 MG tablet Take 1 tablet (800 mg total) by mouth every 12 (twelve) hours as needed. 60 tablet 1  . levothyroxine (SYNTHROID) 88 MCG tablet Take 1 tablet (88 mcg total) by mouth daily before breakfast. 30 tablet 3  . Misc. Devices MISC CPAP on 19 cm of water.  Diagnosis- Obstructive sleep apnea. Medium Wide size Philips Respironics Full Face  Mask Dreamwear mask and heated humidification. 1 each 0  . Multiple Vitamins-Minerals  (MULTIVITAMIN ADULT PO) Take 1 tablet by mouth daily.    . Olmesartan-amLODIPine-HCTZ 40-10-25 MG TABS Take 1 tablet by mouth daily. 30 tablet 6  . atorvastatin (LIPITOR) 20 MG tablet Take 1 tablet (20 mg total) by mouth daily. 30 tablet 6  . omeprazole (PRILOSEC) 20 MG capsule Take 1 capsule (20 mg total) by mouth daily. 20 capsule 0  . FLUoxetine (PROZAC) 20 MG tablet Take 1 tablet (20 mg total) by mouth daily. (Patient not taking: Reported on 01/05/2020) 30 tablet 3  . hydrOXYzine (ATARAX/VISTARIL) 25 MG tablet Take 1 tablet (25 mg total) by mouth 2 (two) times daily as needed. (Patient not taking: Reported on 01/05/2020) 60 tablet 1  . methocarbamol (ROBAXIN) 500 MG tablet Take 1 tablet by mouth 2 (two) times daily as needed for muscle spasms.   3   No facility-administered medications prior to visit.     ROS Review of Systems  Constitutional: Negative for activity change and appetite change.  HENT: Negative for sinus pressure and sore throat.   Eyes: Negative for visual disturbance.  Respiratory: Negative for cough, chest tightness and shortness of breath.   Cardiovascular: Negative for chest pain and leg swelling.  Gastrointestinal: Negative for abdominal distention, abdominal pain, constipation and diarrhea.  Endocrine: Negative.   Genitourinary: Negative for dysuria.  Musculoskeletal: Negative for joint swelling and myalgias.  Skin: Negative for rash.  Allergic/Immunologic: Negative.   Neurological: Negative for weakness, light-headedness and numbness.  Psychiatric/Behavioral: Negative for dysphoric mood and suicidal ideas.    Objective:  BP 139/88   Pulse 83   Ht 6' (1.829 m)   Wt 259 lb 3.2 oz (117.6 kg)   SpO2 97%   BMI 35.15 kg/m   BP/Weight 03/22/2020 02/18/2020 03/04/7627  Systolic BP 315 - -  Diastolic BP 88 - -  Wt. (Lbs) 259.2 258 258  BMI 35.15 34.99 34.99  Some encounter information is confidential and restricted. Go to Review Flowsheets activity to see all  data.      Physical Exam Constitutional:      Appearance: He is well-developed.  Neck:     Vascular: No JVD.  Cardiovascular:     Rate and Rhythm: Normal rate.     Heart sounds: Normal heart sounds. No murmur heard.   Pulmonary:     Effort: Pulmonary effort is normal.     Breath sounds: Normal breath sounds. No wheezing or rales.  Chest:     Chest wall: No tenderness.  Abdominal:     General: Bowel sounds are normal. There is no distension.     Palpations: Abdomen is soft. There is no mass.     Tenderness: There is no abdominal tenderness.  Musculoskeletal:        General: Normal range of motion.     Right lower leg: No edema.     Left lower leg: No edema.  Neurological:     Mental Status: He is alert and oriented to person, place, and time.  Psychiatric:        Mood and Affect: Mood normal.     CMP Latest Ref Rng & Units 01/05/2020 02/24/2019 02/06/2019  Glucose 65 - 99 mg/dL 89 133(H) 108(H)  BUN 6 - 20 mg/dL '9 12 10  ' Creatinine 0.76 - 1.27 mg/dL 0.96 0.96 0.94  Sodium 134 - 144 mmol/L 135 133(L) 138  Potassium 3.5 - 5.2 mmol/L 3.6 3.5 3.9  Chloride 96 - 106 mmol/L 97 97(L) 98  CO2 20 - 29 mmol/L '25 24 20  ' Calcium 8.7 - 10.2 mg/dL 9.9 9.5 10.1  Total Protein 6.5 - 8.1 g/dL - 8.0 7.8  Total Bilirubin 0.3 - 1.2 mg/dL - 1.2 0.8  Alkaline Phos 38 - 126 U/L - 44 52  AST 15 - 41 U/L - 29 26  ALT 0 - 44 U/L - 39 26    Lipid Panel     Component Value Date/Time   CHOL 256 (H) 02/06/2019 1237   TRIG 100 02/06/2019 1237   HDL 70 02/06/2019 1237   CHOLHDL 3.7 02/06/2019 1237   LDLCALC 166 (H) 02/06/2019 1237    CBC    Component Value Date/Time   WBC 5.0 02/24/2019 1215   RBC 4.89 02/24/2019 1215   HGB 14.9 02/24/2019 1215   HCT 44.1 02/24/2019 1215   PLT 256 02/24/2019 1215   MCV 90.2 02/24/2019 1215   MCH 30.5 02/24/2019 1215   MCHC 33.8 02/24/2019 1215   RDW 12.8 02/24/2019 1215   LYMPHSABS 2.1 02/24/2019 1215   MONOABS 0.8 02/24/2019 1215   EOSABS 0.1  02/24/2019 1215   BASOSABS 0.0 02/24/2019 1215    No results found for: HGBA1C  Assessment & Plan:  1. OSA (obstructive sleep apnea) Doing well on CPAP machine  2. Pure hypercholesterolemia Uncontrolled Currently taking Lipitor every other day - atorvastatin (LIPITOR) 20 MG tablet; Take 1 tablet (20 mg total) by  mouth daily.  Dispense: 30 tablet; Refill: 6 - LP+Non-HDL Cholesterol  3. Tobacco use Spent 3 minutes counseling on cessation and he is working on cutting back but is not ready to quit now  4. Essential hypertension Controlled Counseled on blood pressure goal of less than 130/80, low-sodium, DASH diet, medication compliance, 150 minutes of moderate intensity exercise per week. Discussed medication compliance, adverse effects. - CMP14+EGFR  5. Class 2 obesity due to excess calories without serious comorbidity with body mass index (BMI) of 35.0 to 35.9 in adult Counseled on reducing portion sizes, increasing physical activity  6. Screening for viral disease - HCV RNA quant rflx ultra or genotyp([facility]/Sun[facility]) - HIV Antibody (routine testing w rflx)  7. Hypothyroidism due to acquired atrophy of thyroid Controlled - T4, free - TSH  8. Alcohol use Counseled on alcohol cessation  9. Gastroesophageal reflux disease without esophagitis Uncontrolled I have refilled Prilosec which she has been out of - omeprazole (PRILOSEC) 20 MG capsule; Take 1 capsule (20 mg total) by mouth daily.  Dispense: 30 capsule; Refill: 6    Meds ordered this encounter  Medications  . omeprazole (PRILOSEC) 20 MG capsule    Sig: Take 1 capsule (20 mg total) by mouth daily.    Dispense:  30 capsule    Refill:  6  . atorvastatin (LIPITOR) 20 MG tablet    Sig: Take 1 tablet (20 mg total) by mouth daily.    Dispense:  30 tablet    Refill:  6    Follow-up: Return in about 3 months (around 06/22/2020) for Chronic disease management.       Charlott Rakes, MD, FAAFP. [facility] and [facility] [place], [place]   03/22/2020, 10:14 AM

## 2020-03-23 LAB — T4, FREE: Free T4: 1.25 ng/dL (ref 0.82–1.77)

## 2020-03-23 LAB — CMP14+EGFR
ALT: 28 IU/L (ref 0–44)
AST: 19 IU/L (ref 0–40)
Albumin/Globulin Ratio: 1.6 (ref 1.2–2.2)
Albumin: 4.7 g/dL (ref 4.0–5.0)
Alkaline Phosphatase: 67 IU/L (ref 48–121)
BUN/Creatinine Ratio: 16 (ref 9–20)
BUN: 14 mg/dL (ref 6–20)
Bilirubin Total: 0.6 mg/dL (ref 0.0–1.2)
CO2: 23 mmol/L (ref 20–29)
Calcium: 9.5 mg/dL (ref 8.7–10.2)
Chloride: 97 mmol/L (ref 96–106)
Creatinine, Ser: 0.89 mg/dL (ref 0.76–1.27)
GFR calc Af Amer: 131 mL/min/{1.73_m2} (ref >59)
GFR calc non Af Amer: 113 mL/min/{1.73_m2} (ref >59)
Globulin, Total: 2.9 g/dL (ref 1.5–4.5)
Glucose: 109 mg/dL — ABNORMAL HIGH (ref 65–99)
Potassium: 3.8 mmol/L (ref 3.5–5.2)
Sodium: 137 mmol/L (ref 134–144)
Total Protein: 7.6 g/dL (ref 6.0–8.5)

## 2020-03-23 LAB — HIV ANTIBODY (ROUTINE TESTING W REFLEX): HIV Screen 4th Generation wRfx: NONREACTIVE

## 2020-03-23 LAB — LP+NON-HDL CHOLESTEROL
Cholesterol, Total: 172 mg/dL (ref 100–199)
HDL: 54 mg/dL (ref >39)
LDL Chol Calc (NIH): 104 mg/dL — ABNORMAL HIGH (ref 0–99)
Total Non-HDL-Chol (LDL+VLDL): 118 mg/dL (ref 0–129)
Triglycerides: 71 mg/dL (ref 0–149)
VLDL Cholesterol Cal: 14 mg/dL (ref 5–40)

## 2020-03-23 LAB — HCV RNA QUANT RFLX ULTRA OR GENOTYP: HCV Quant Baseline: NOT DETECTED IU/mL

## 2020-03-23 LAB — TSH: TSH: 4.53 u[IU]/mL — ABNORMAL HIGH (ref 0.450–4.500)

## 2020-03-31 MED FILL — OLMSRTN-AMLDPN-HCTZ 40-10-2: 40-10-25 | 30 days supply | Qty: 30 | Fill #3

## 2020-04-06 ENCOUNTER — Ambulatory Visit: Payer: PRIVATE HEALTH INSURANCE | Admitting: Family Medicine

## 2020-04-27 MED FILL — LEVOTHYROXINE 88 MCG TABLET: 88 | 30 days supply | Qty: 30 | Fill #3

## 2020-04-28 MED FILL — OLMSRTN-AMLDPN-HCTZ 40-10-2: 40-10-25 | 30 days supply | Qty: 30 | Fill #4

## 2020-06-01 ENCOUNTER — Other Ambulatory Visit: Payer: Self-pay | Admitting: Family Medicine

## 2020-06-01 DIAGNOSIS — E034 Atrophy of thyroid (acquired): Secondary | ICD-10-CM

## 2020-06-14 ENCOUNTER — Other Ambulatory Visit: Payer: Self-pay | Admitting: Family Medicine

## 2020-06-14 DIAGNOSIS — E034 Atrophy of thyroid (acquired): Secondary | ICD-10-CM

## 2020-06-14 MED FILL — OLMSRTN-AMLDPN-HCTZ 40-10-2: 40-10-25 | 30 days supply | Qty: 30 | Fill #5

## 2020-06-15 ENCOUNTER — Other Ambulatory Visit: Payer: Self-pay | Admitting: Pharmacist

## 2020-06-15 DIAGNOSIS — E034 Atrophy of thyroid (acquired): Secondary | ICD-10-CM

## 2020-06-15 MED ORDER — LEVOTHYROXINE SODIUM 88 MCG PO TABS: 88.0000 ug | ORAL_TABLET | Freq: Every day | ORAL | 2 refills | Status: DC

## 2020-06-15 MED FILL — LEVOTHYROXINE 88 MCG TABLET: 88 | 30 days supply | Qty: 30 | Fill #0

## 2020-06-22 ENCOUNTER — Other Ambulatory Visit: Payer: Self-pay

## 2020-06-22 ENCOUNTER — Encounter: Payer: Self-pay | Admitting: Family Medicine

## 2020-06-22 ENCOUNTER — Ambulatory Visit: Payer: Self-pay | Attending: Family Medicine | Admitting: Family Medicine

## 2020-06-22 ENCOUNTER — Other Ambulatory Visit: Payer: Self-pay | Admitting: Family Medicine

## 2020-06-22 DIAGNOSIS — E034 Atrophy of thyroid (acquired): Secondary | ICD-10-CM

## 2020-06-22 DIAGNOSIS — K219 Gastro-esophageal reflux disease without esophagitis: Secondary | ICD-10-CM

## 2020-06-22 DIAGNOSIS — E7800 Pure hypercholesterolemia, unspecified: Secondary | ICD-10-CM

## 2020-06-22 DIAGNOSIS — I10 Essential (primary) hypertension: Secondary | ICD-10-CM

## 2020-06-22 DIAGNOSIS — Z720 Tobacco use: Secondary | ICD-10-CM

## 2020-06-22 MED ORDER — LEVOTHYROXINE SODIUM 88 MCG PO TABS: 88.0000 ug | ORAL_TABLET | Freq: Every day | ORAL | 6 refills | Status: DC

## 2020-06-22 MED ORDER — OLMESARTAN-AMLODIPINE-HCTZ 40-10-25 MG PO TABS: 1.0000 | ORAL_TABLET | Freq: Every day | ORAL | 6 refills | Status: DC

## 2020-06-22 MED ORDER — NICOTINE 7 MG/24HR TD PT24: 7.0000 mg | MEDICATED_PATCH | Freq: Every day | TRANSDERMAL | 2 refills | Status: DC

## 2020-06-22 MED ORDER — OMEPRAZOLE 20 MG PO CPDR: 20.0000 mg | DELAYED_RELEASE_CAPSULE | Freq: Every day | ORAL | 6 refills | Status: DC

## 2020-06-22 MED ORDER — ATORVASTATIN CALCIUM 20 MG PO TABS: 20.0000 mg | ORAL_TABLET | Freq: Every day | ORAL | 6 refills | Status: DC

## 2020-06-22 MED FILL — NICOTINE 7 MG/24HR PATCH: 7 | 28 days supply | Qty: 28 | Fill #0

## 2020-06-22 MED FILL — ATORVASTATIN CALCIUM 20 MG: 20 | 30 days supply | Qty: 30 | Fill #0

## 2020-06-22 MED FILL — OMEPRAZOLE 20 MG CAP: 20 | 30 days supply | Qty: 30 | Fill #0

## 2020-06-22 NOTE — Progress Notes (Signed)
Patient needs refills on medications. 

## 2020-06-22 NOTE — Progress Notes (Signed)
Virtual Visit via Telephone Note  I connected with Darren Barajas, on 06/22/2020 at 9:21 AM by telephone due to the COVID-19 pandemic and verified that I am speaking with the correct person using two identifiers.   Consent: I discussed the limitations, risks, security and privacy concerns of performing an evaluation and management service by telephone and the availability of in person appointments. I also discussed with the patient that there may be a patient responsible charge related to this service. The patient expressed understanding and agreed to proceed.   Location of Patient: Work  Government social research officer of Provider: Clinic   Persons participating in Telemedicine visit: Jandel Patriarca Farrington-CMA Dr. Alvis Lemmings     History of Present Illness: Darren Barajas is a 34 year old male with a history of migraines, OSA (on CPAP), hypertension, hyperlipidemia, hypothyroidism seen for follow-up visit. His sleep is much better with the CPAP. Tolerating his antihypertensive and states the last time he is checked his blood pressure it was normal.  He takes his atorvastatin intermittently. Doing well on levothyroxine for hypothyroidism and last TSH was slightly elevated above goal but no regimen changes were made.  Smokes 1 pack cig/3 days; he has lost 8 lbs by means of being physically active  Past Medical History:  Diagnosis Date  . Anginal pain ([facility])   . Chronic bronchitis ([facility])    "get it q couple months; q couple weeks" (04/07/2015)  . Graves' disease    treated w/radioactive ioding ablation in 2001  . Headache    "weekly" (04/07/2015)  . Hypertension    Not currently taking medications   No Known Allergies  Current Outpatient Medications on File Prior to Visit  Medication Sig Dispense Refill  . atorvastatin (LIPITOR) 20 MG tablet Take 1 tablet (20 mg total) by mouth daily. 30 tablet 6  . ibuprofen (ADVIL) 800 MG tablet Take 1 tablet (800 mg total) by mouth every 12  (twelve) hours as needed. 60 tablet 1  . levothyroxine (SYNTHROID) 88 MCG tablet Take 1 tablet (88 mcg total) by mouth daily before breakfast. 30 tablet 2  . Misc. Devices MISC CPAP on 19 cm of water.  Diagnosis- Obstructive sleep apnea. Medium Wide size Philips Respironics Full Face  Mask Dreamwear mask and heated humidification. 1 each 0  . Multiple Vitamins-Minerals (MULTIVITAMIN ADULT PO) Take 1 tablet by mouth daily.    . Olmesartan-amLODIPine-HCTZ 40-10-25 MG TABS Take 1 tablet by mouth daily. 30 tablet 6  . omeprazole (PRILOSEC) 20 MG capsule Take 1 capsule (20 mg total) by mouth daily. 30 capsule 6  . methocarbamol (ROBAXIN) 500 MG tablet Take 1 tablet by mouth 2 (two) times daily as needed for muscle spasms.   3   No current facility-administered medications on file prior to visit.    Observations/Objective: Awake, alert, oriented x3 Not in acute distress  Lab Results  Component Value Date   TSH 4.530 (H) 03/22/2020    CMP Latest Ref Rng & Units 03/22/2020 01/05/2020 02/24/2019  Glucose 65 - 99 mg/dL 465(K) 89 354(S)  BUN 6 - 20 mg/dL 14 9 12   Creatinine 0.76 - 1.27 mg/dL 5.68 1.27  Sodium 134 - 144 mmol/L 137 135 133(L)  Potassium 3.5 - 5.2 mmol/L 3.8 3.6 3.5  Chloride 96 - 106 mmol/L 97 97 97(L)  CO2 20 - 29 mmol/L 23 25 24   Calcium 8.7 - 10.2 mg/dL 9.5 9.9 9.5  Total Protein 6.0 - 8.5 g/dL 7.6 - 8.0  Total Bilirubin 0.0 -  1.2 mg/dL 0.6 - 1.2  Alkaline Phos 48 - 121 IU/L 67 - 44  AST 0 - 40 IU/L 19 - 29  ALT 0 - 44 IU/L 28 - 39    Lipid Panel     Component Value Date/Time   CHOL 172 03/22/2020 1020   TRIG 71 03/22/2020 1020   HDL 54 03/22/2020 1020   CHOLHDL 3.7 02/06/2019 1237   LDLCALC 104 (H) 03/22/2020 1020   LABVLDL 14 03/22/2020 1020     Assessment and Plan: 1. Hypothyroidism due to acquired atrophy of thyroid Slightly above goal No regimen change Continue to administer on an empty stomach first thing in the morning Thyroid panel at next  visit - levothyroxine (SYNTHROID) 88 MCG tablet; Take 1 tablet (88 mcg total) by mouth daily before breakfast.  Dispense: 30 tablet; Refill: 6  2. Essential hypertension Controlled Counseled on blood pressure goal of less than 130/80, low-sodium, DASH diet, medication compliance, 150 minutes of moderate intensity exercise per week. Discussed medication compliance, adverse effects. - Olmesartan-amLODIPine-HCTZ 40-10-25 MG TABS; Take 1 tablet by mouth daily.  Dispense: 30 tablet; Refill: 6  3. Gastroesophageal reflux disease without esophagitis Stable - omeprazole (PRILOSEC) 20 MG capsule; Take 1 capsule (20 mg total) by mouth daily.  Dispense: 30 capsule; Refill: 6  4. Pure hypercholesterolemia Controlled Not compliant with Lipitor and has been advised to be more compliant - atorvastatin (LIPITOR) 20 MG tablet; Take 1 tablet (20 mg total) by mouth daily.  Dispense: 30 tablet; Refill: 6  5. Tobacco use Spent 3 minutes counseling on smoking cessation and he is working on quitting - nicotine (NICODERM CQ) 7 mg/24hr patch; Place 1 patch (7 mg total) onto the skin daily.  Dispense: 28 patch; Refill: 2   Follow Up Instructions: Return in about 6 months (around 12/21/2020) for Chronic disease management.    I discussed the assessment and treatment plan with the patient. The patient was provided an opportunity to ask questions and all were answered. The patient agreed with the plan and demonstrated an understanding of the instructions.   The patient was advised to call back or seek an in-person evaluation if the symptoms worsen or if the condition fails to improve as anticipated.     I provided 13 minutes total of non-face-to-face time during this encounter including median intraservice time, reviewing previous notes, investigations, ordering medications, medical decision making, coordinating care and patient verbalized understanding at the end of the visit.     Hoy Register, MD,  FAAFP. [facility] [place], [place] [phone]   06/22/2020, 9:21 AM

## 2020-07-19 MED FILL — OLMSRTN-AMLDPN-HCTZ 40-10-2: 40-10-25 | 30 days supply | Qty: 30 | Fill #0

## 2020-08-23 MED FILL — OLMSRTN-AMLDPN-HCTZ 40-10-2: 40-10-25 | 30 days supply | Qty: 30 | Fill #1

## 2020-09-23 MED FILL — OLMSRTN-AMLDPN-HCTZ 40-10-2: 40-10-25 | 30 days supply | Qty: 30 | Fill #2

## 2020-10-21 MED FILL — OLMSRTN-AMLDPN-HCTZ 40-10-2: 40-10-25 | 30 days supply | Qty: 30 | Fill #3

## 2020-12-11 ENCOUNTER — Other Ambulatory Visit: Payer: Self-pay

## 2020-12-12 ENCOUNTER — Other Ambulatory Visit: Payer: Self-pay | Admitting: Pharmacist

## 2020-12-12 ENCOUNTER — Other Ambulatory Visit: Payer: Self-pay

## 2020-12-12 DIAGNOSIS — E034 Atrophy of thyroid (acquired): Secondary | ICD-10-CM

## 2020-12-12 MED ORDER — LEVOTHYROXINE SODIUM 88 MCG PO TABS
88.0000 ug | ORAL_TABLET | Freq: Every day | ORAL | 0 refills | Status: DC
  Filled 2020-12-12 – 2020-12-21 (×2): qty 30, 30d supply, fill #0

## 2020-12-13 ENCOUNTER — Other Ambulatory Visit: Payer: Self-pay | Admitting: Family Medicine

## 2020-12-13 ENCOUNTER — Other Ambulatory Visit: Payer: Self-pay

## 2020-12-13 DIAGNOSIS — I10 Essential (primary) hypertension: Secondary | ICD-10-CM

## 2020-12-13 NOTE — Telephone Encounter (Signed)
Requested medications are due for refill today.  yes  Requested medications are on the active medications list.  yes  Last refill. 03/03/2019  Future visit scheduled.   no Notes to clinic.  Rx expired. Pt more than 3 months overdue.

## 2020-12-13 NOTE — Telephone Encounter (Signed)
Medication Refill - Medication: ibuprofen (ADVIL) 800 MG tablet     Preferred Pharmacy (with phone number or street name):  [facility] Phone:  [phone]  Fax:  [phone]       Agent: Please be advised that RX refills may take up to 3 business days. We ask that you follow-up with your pharmacy.

## 2020-12-14 ENCOUNTER — Other Ambulatory Visit: Payer: Self-pay

## 2020-12-14 MED ORDER — IBUPROFEN 800 MG PO TABS
800.0000 mg | ORAL_TABLET | Freq: Two times a day (BID) | ORAL | 0 refills | Status: DC | PRN
  Filled 2020-12-14 – 2020-12-27 (×2): qty 60, 30d supply, fill #0

## 2020-12-20 ENCOUNTER — Other Ambulatory Visit: Payer: Self-pay

## 2020-12-21 ENCOUNTER — Other Ambulatory Visit: Payer: Self-pay

## 2020-12-21 MED FILL — Olmesartan-Amlodipine-Hydrochlorothiazide Tab 40-10-25 MG: ORAL | 30 days supply | Qty: 30 | Fill #0 | Status: CN

## 2020-12-22 ENCOUNTER — Other Ambulatory Visit: Payer: Self-pay

## 2020-12-27 ENCOUNTER — Other Ambulatory Visit: Payer: Self-pay

## 2020-12-28 ENCOUNTER — Other Ambulatory Visit: Payer: Self-pay

## 2020-12-29 ENCOUNTER — Other Ambulatory Visit: Payer: Self-pay

## 2021-01-03 ENCOUNTER — Other Ambulatory Visit: Payer: Self-pay

## 2021-01-25 MED FILL — Olmesartan-Amlodipine-Hydrochlorothiazide Tab 40-10-25 MG: ORAL | 30 days supply | Qty: 30 | Fill #0 | Status: CN

## 2021-01-26 ENCOUNTER — Other Ambulatory Visit: Payer: Self-pay

## 2021-02-02 ENCOUNTER — Other Ambulatory Visit: Payer: Self-pay

## 2021-02-09 ENCOUNTER — Other Ambulatory Visit: Payer: Self-pay

## 2021-02-09 MED FILL — Olmesartan-Amlodipine-Hydrochlorothiazide Tab 40-10-25 MG: ORAL | 30 days supply | Qty: 30 | Fill #0 | Status: AC

## 2021-03-10 MED FILL — Olmesartan-Amlodipine-Hydrochlorothiazide Tab 40-10-25 MG: ORAL | 30 days supply | Qty: 30 | Fill #1 | Status: AC

## 2021-03-11 ENCOUNTER — Other Ambulatory Visit: Payer: Self-pay

## 2021-03-15 ENCOUNTER — Other Ambulatory Visit: Payer: Self-pay

## 2021-04-15 ENCOUNTER — Telehealth: Payer: Self-pay | Admitting: Family Medicine

## 2021-04-15 ENCOUNTER — Other Ambulatory Visit: Payer: Self-pay

## 2021-04-15 DIAGNOSIS — I10 Essential (primary) hypertension: Secondary | ICD-10-CM

## 2021-04-15 NOTE — Telephone Encounter (Signed)
Pt is calling to check to check on the status of the refill. Pt states he has been off of the medication for 3 days. Please advise

## 2021-04-18 ENCOUNTER — Other Ambulatory Visit: Payer: Self-pay

## 2021-04-18 MED ORDER — OLMESARTAN-AMLODIPINE-HCTZ 40-10-25 MG PO TABS
1.0000 | ORAL_TABLET | Freq: Every day | ORAL | 0 refills | Status: DC
  Filled 2021-04-18 – 2021-04-27 (×2): qty 7, 7d supply, fill #0

## 2021-04-18 NOTE — Telephone Encounter (Signed)
Patient called in to schedule a virtual visit with Dr Alvis Lemmings nothing available till October. Patient scheduled 04/20/21 with [facility] for a virtual visit say that he can not get to the office. Asking if he can get a refill until he can come in to be seen. Ph# [phone]

## 2021-04-18 NOTE — Addendum Note (Signed)
Addended by: Lois Huxley, Maddox Bratcher L on: 04/18/2021 12:12 PM   Modules accepted: Orders

## 2021-04-18 NOTE — Telephone Encounter (Signed)
Rx sent for short, week supply.

## 2021-04-20 ENCOUNTER — Other Ambulatory Visit: Payer: Self-pay

## 2021-04-20 ENCOUNTER — Ambulatory Visit: Payer: Self-pay | Admitting: Physician Assistant

## 2021-04-25 ENCOUNTER — Other Ambulatory Visit: Payer: Self-pay

## 2021-04-25 ENCOUNTER — Other Ambulatory Visit: Payer: Self-pay | Admitting: Family Medicine

## 2021-04-25 DIAGNOSIS — K219 Gastro-esophageal reflux disease without esophagitis: Secondary | ICD-10-CM

## 2021-04-25 DIAGNOSIS — I10 Essential (primary) hypertension: Secondary | ICD-10-CM

## 2021-04-25 NOTE — Telephone Encounter (Signed)
  Notes to clinic:  Patient not scheduled until 07/12/2021 and would like enough medication until that time    Requested Prescriptions  Pending Prescriptions Disp Refills   Olmesartan-amLODIPine-HCTZ 40-10-25 MG TABS 7 tablet 0    Sig: TAKE 1 TABLET BY MOUTH DAILY.     Cardiovascular: CCB + ARB + Diuretic Combos Failed - 04/25/2021 12:40 PM      Failed - K in normal range and within 180 days    Potassium  Date Value Ref Range Status  03/22/2020 3.8 3.5 - 5.2 mmol/L Final          Failed - Na in normal range and within 180 days    Sodium  Date Value Ref Range Status  03/22/2020 137 134 - 144 mmol/L Final          Failed - Cr in normal range and within 180 days    Creatinine, Ser  Date Value Ref Range Status  03/22/2020 0.89 0.76 - 1.27 mg/dL Final          Failed - Ca in normal range and within 180 days    Calcium  Date Value Ref Range Status  03/22/2020 9.5 8.7 - 10.2 mg/dL Final   Calcium, Ion  Date Value Ref Range Status  07/17/2018 1.20 1.15 - 1.40 mmol/L Final          Failed - Valid encounter within last 6 months    Recent Outpatient Visits           10 months ago Tobacco use   [facility], Odette Horns, MD   1 year ago OSA (obstructive sleep apnea)   [facility] [place] [facility] Hoy Register, MD   1 year ago Suspected sleep apnea   [facility] Hoy Register, MD   2 years ago Essential hypertension   [place] Hoy Register, MD   2 years ago Anxiety and depression   [facility] Hoy Register, MD       Future Appointments             In 2 months Hoy Register, MD [facility]            Passed - Patient is not pregnant      Passed - Last BP in normal range    BP Readings from Last 1 Encounters:  03/22/20 139/88          Passed - Last Heart Rate in normal range     Pulse Readings from Last 1 Encounters:  03/22/20 83

## 2021-04-25 NOTE — Telephone Encounter (Signed)
Copied from CRM [id number]. Topic: Quick Communication - Rx Refill/Question >> Apr 25, 2021 12:30 PM Jaquita Rector A wrote: Medication: Olmesartan-amLODIPine-HCTZ 40-10-25 MG TABS  Completely out have future appt. 07/12/21 first available Has the patient contacted their pharmacy? Yes.   (Agent: If no, request that the patient contact the pharmacy for the refill.) (Agent: If yes, when and what did the pharmacy advise?)  Preferred Pharmacy (with phone number or street name): [facility]  Phone:  [phone] Fax:  [phone]     Agent: Please be advised that RX refills may take up to 3 business days. We ask that you follow-up with your pharmacy.

## 2021-04-26 ENCOUNTER — Other Ambulatory Visit: Payer: Self-pay | Admitting: Family Medicine

## 2021-04-26 ENCOUNTER — Other Ambulatory Visit: Payer: Self-pay

## 2021-04-26 DIAGNOSIS — I10 Essential (primary) hypertension: Secondary | ICD-10-CM

## 2021-04-26 NOTE — Telephone Encounter (Signed)
Requested medications are due for refill today.  yes  Requested medications are on the active medications list.  yes  Last refill. 04/18/2021  Future visit scheduled.   yes  Notes to clinic.  Pt has been given a courtesy refill and has been given enough med to get to upcoming appointment. Please advise.

## 2021-04-27 ENCOUNTER — Other Ambulatory Visit: Payer: Self-pay

## 2021-04-28 ENCOUNTER — Other Ambulatory Visit: Payer: Self-pay

## 2021-05-06 ENCOUNTER — Other Ambulatory Visit: Payer: Self-pay

## 2021-05-06 ENCOUNTER — Other Ambulatory Visit: Payer: Self-pay | Admitting: Family Medicine

## 2021-05-06 DIAGNOSIS — I10 Essential (primary) hypertension: Secondary | ICD-10-CM

## 2021-05-07 NOTE — Telephone Encounter (Signed)
Requested medication (s) are due for refill today: yes  Requested medication (s) are on the active medication list: yes but prescription ended  Last refill:  04/18/21   Future visit scheduled: yes  Notes to clinic:  pt has upcoming appt- med end date was 05/05/21   Requested Prescriptions  Pending Prescriptions Disp Refills   Olmesartan-amLODIPine-HCTZ 40-10-25 MG TABS 7 tablet 0    Sig: TAKE 1 TABLET BY MOUTH DAILY.     Cardiovascular: CCB + ARB + Diuretic Combos Failed - 05/06/2021  3:57 PM      Failed - K in normal range and within 180 days    Potassium  Date Value Ref Range Status  03/22/2020 3.8 3.5 - 5.2 mmol/L Final          Failed - Na in normal range and within 180 days    Sodium  Date Value Ref Range Status  03/22/2020 137 134 - 144 mmol/L Final          Failed - Cr in normal range and within 180 days    Creatinine, Ser  Date Value Ref Range Status  03/22/2020 0.89 0.76 - 1.27 mg/dL Final          Failed - Ca in normal range and within 180 days    Calcium  Date Value Ref Range Status  03/22/2020 9.5 8.7 - 10.2 mg/dL Final   Calcium, Ion  Date Value Ref Range Status  07/17/2018 1.20 1.15 - 1.40 mmol/L Final          Failed - Valid encounter within last 6 months    Recent Outpatient Visits           10 months ago Tobacco use   [facility] [place], Odette Horns, MD   1 year ago OSA (obstructive sleep apnea)   [facility] Hoy Register, MD   1 year ago Suspected sleep apnea   [facility] Hoy Register, MD   2 years ago Essential hypertension   [facility] Hoy Register, MD   2 years ago Anxiety and depression   [place] Hoy Register, MD       Future Appointments             In 3 days Hoy Register, MD [facility]            Passed - Patient is not  pregnant      Passed - Last BP in normal range    BP Readings from Last 1 Encounters:  03/22/20 139/88          Passed - Last Heart Rate in normal range    Pulse Readings from Last 1 Encounters:  03/22/20 83

## 2021-05-10 ENCOUNTER — Other Ambulatory Visit: Payer: Self-pay

## 2021-05-10 ENCOUNTER — Encounter: Payer: Self-pay | Admitting: Family Medicine

## 2021-05-10 ENCOUNTER — Ambulatory Visit (HOSPITAL_BASED_OUTPATIENT_CLINIC_OR_DEPARTMENT_OTHER): Payer: Self-pay | Admitting: Family Medicine

## 2021-05-10 DIAGNOSIS — I10 Essential (primary) hypertension: Secondary | ICD-10-CM

## 2021-05-10 DIAGNOSIS — E7800 Pure hypercholesterolemia, unspecified: Secondary | ICD-10-CM

## 2021-05-10 DIAGNOSIS — E034 Atrophy of thyroid (acquired): Secondary | ICD-10-CM

## 2021-05-10 DIAGNOSIS — Z9114 Patient's other noncompliance with medication regimen: Secondary | ICD-10-CM

## 2021-05-10 NOTE — Progress Notes (Signed)
Virtual Visit via Telephone Note  I connected with Darren Barajas, on 05/10/2021 at 9:41 AM by telephone due to the COVID-19 pandemic and verified that I am speaking with the correct person using two identifiers.   Consent: I discussed the limitations, risks, security and privacy concerns of performing an evaluation and management service by telephone and the availability of in person appointments. I also discussed with the patient that there may be a patient responsible charge related to this service. The patient expressed understanding and agreed to proceed.   Location of Patient: Work  Biomedical scientist of Provider: Clinic   Persons participating in Telemedicine visit: Darren Barajas Dr. Margarita Rana     History of Present Illness: Darren Barajas is a 35 y.o. year old male with a history of migraines, obstructive sleep apnea (on CPAP), hypertension, hypothyroidism, hyperlipidemia seen for chronic disease management.   He has not been taking Atorvastatin and has not taken his Levothyroxine for 4 months. States Levothyroxine was making   him feel funny when he discontinued that his chest up hurting.  He has lost 6 pounds and has been working out, exercising and eating mostly fruits and vegetables.  Of note his cholesterol was elevated in 2020 with a total cholesterol of 256 which is down to 172 from last year. Endorses only being compliant with his antihypertensive. Past Medical History:  Diagnosis Date   Anginal pain ([place])    Chronic bronchitis ([place])    "get it q couple months; q couple weeks" (04/07/2015)   Graves' disease    treated w/radioactive ioding ablation in 2001   Headache    "weekly" (04/07/2015)   Hypertension    Not currently taking medications   No Known Allergies  Current Outpatient Medications on File Prior to Visit  Medication Sig Dispense Refill   atorvastatin (LIPITOR) 20 MG tablet Take 1 tablet (20 mg total) by mouth daily. 30 tablet 6   ibuprofen  (ADVIL) 800 MG tablet Take 1 tablet (800 mg total) by mouth every 12 (twelve) hours as needed. 60 tablet 0   levothyroxine (SYNTHROID) 88 MCG tablet Take 1 tablet (88 mcg total) by mouth daily before breakfast. 30 tablet 0   methocarbamol (ROBAXIN) 500 MG tablet Take 1 tablet by mouth 2 (two) times daily as needed for muscle spasms.   3   Misc. Devices MISC CPAP on 19 cm of water.  Diagnosis- Obstructive sleep apnea. Medium Wide size Philips Respironics Full Face  Mask Dreamwear mask and heated humidification. 1 each 0   Multiple Vitamins-Minerals (MULTIVITAMIN ADULT PO) Take 1 tablet by mouth daily.     nicotine (NICODERM CQ) 7 mg/24hr patch Place 1 patch (7 mg total) onto the skin daily. 28 patch 2   Olmesartan-amLODIPine-HCTZ 40-10-25 MG TABS TAKE 1 TABLET BY MOUTH DAILY. 7 tablet 0   omeprazole (PRILOSEC) 20 MG capsule Take 1 capsule (20 mg total) by mouth daily. 30 capsule 6   No current facility-administered medications on file prior to visit.    ROS: See HPI  Observations/Objective: Awake, alert, oriented x3 Not in acute distress Normal mood  CMP Latest Ref Rng & Units 03/22/2020 01/05/2020 02/24/2019  Glucose 65 - 99 mg/dL 109(H) 89 133(H)  BUN 6 - 20 mg/dL _0 Creatinine 0.76 - 1.27 mg/dL 0.89 0.96 0.96  Sodium 134 - 144 mmol/L 137 135 133(L)  Potassium 3.5 - 5.2 mmol/L 3.8 3.6 3.5  Chloride 96 - 106 mmol/L 97 97 97(L)  CO2 20 -  29 mmol/L _0 Calcium 8.7 - 10.2 mg/dL 9.5 9.9 9.5  Total Protein 6.0 - 8.5 g/dL 7.6 - 8.0  Total Bilirubin 0.0 - 1.2 mg/dL 0.6 - 1.2  Alkaline Phos 48 - 121 IU/L 67 - 44  AST 0 - 40 IU/L 19 - 29  ALT 0 - 44 IU/L 28 - 39    Lipid Panel     Component Value Date/Time   CHOL 172 03/22/2020 1020   TRIG 71 03/22/2020 1020   HDL 54 03/22/2020 1020   CHOLHDL 3.7 02/06/2019 1237   LDLCALC 104 (H) 03/22/2020 1020   LABVLDL 14 03/22/2020 1020    No results found for: HGBA1C  Assessment and Plan: 1. Hypothyroidism due to acquired  atrophy of thyroid Last TSH was uncontrolled He has not been compliant with levothyroxine Will send off labs and adjust regimen accordingly Advised that his nonspecific symptoms have been experienced with previous medications in the past and may not be necessarily related to his levothyroxine - T4, free; Future - TSH; Future  2. Essential hypertension Stable Continue antihypertensive Counseled on blood pressure goal of less than 130/80, low-sodium, DASH diet, medication compliance, 150 minutes of moderate intensity exercise per week. Discussed medication compliance, adverse effects. - CMP14+EGFR; Future - Lipid panel; Future  3. Pure hypercholesterolemia Controlled Noncompliant with statin Last lipid panel was normal; will check again If cholesterol is elevated he will need to restart his atorvastatin Low-cholesterol diet  4. Non compliance w medication regimen Counseled on the need to be compliant with his medications and implications of noncompliance have been discussed.   Follow Up Instructions: 6 months   I discussed the assessment and treatment plan with the patient. The patient was provided an opportunity to ask questions and all were answered. The patient agreed with the plan and demonstrated an understanding of the instructions.   The patient was advised to call back or seek an in-person evaluation if the symptoms worsen or if the condition fails to improve as anticipated.     I provided 13 minutes total of non-face-to-face time during this encounter.   Charlott Rakes, MD, FAAFP. [facility] [place], [place]   05/10/2021, 9:41 AM

## 2021-05-12 ENCOUNTER — Other Ambulatory Visit: Payer: Self-pay

## 2021-05-12 MED ORDER — OLMESARTAN-AMLODIPINE-HCTZ 40-10-25 MG PO TABS
1.0000 | ORAL_TABLET | Freq: Every day | ORAL | 6 refills | Status: DC
  Filled 2021-05-12: qty 30, 30d supply, fill #0
  Filled 2021-06-27: qty 30, 30d supply, fill #1
  Filled 2021-07-28: qty 30, 30d supply, fill #2
  Filled 2021-08-26: qty 30, 30d supply, fill #3
  Filled 2021-09-14: qty 30, 30d supply, fill #0
  Filled 2021-09-14: qty 30, 30d supply, fill #3
  Filled 2021-10-13 – 2021-12-14 (×2): qty 30, 30d supply, fill #1
  Filled 2022-01-31: qty 30, 30d supply, fill #2
  Filled 2022-03-15: qty 30, 30d supply, fill #3

## 2021-05-13 ENCOUNTER — Other Ambulatory Visit: Payer: Self-pay

## 2021-05-17 ENCOUNTER — Encounter: Payer: Self-pay | Admitting: Family Medicine

## 2021-05-17 ENCOUNTER — Ambulatory Visit: Payer: Self-pay | Attending: Family Medicine

## 2021-05-17 ENCOUNTER — Other Ambulatory Visit: Payer: Self-pay

## 2021-05-17 DIAGNOSIS — I10 Essential (primary) hypertension: Secondary | ICD-10-CM

## 2021-05-17 DIAGNOSIS — E034 Atrophy of thyroid (acquired): Secondary | ICD-10-CM

## 2021-05-18 ENCOUNTER — Other Ambulatory Visit: Payer: Self-pay | Admitting: Family Medicine

## 2021-05-18 ENCOUNTER — Other Ambulatory Visit: Payer: Self-pay

## 2021-05-18 DIAGNOSIS — E034 Atrophy of thyroid (acquired): Secondary | ICD-10-CM

## 2021-05-18 DIAGNOSIS — E7800 Pure hypercholesterolemia, unspecified: Secondary | ICD-10-CM

## 2021-05-18 LAB — LIPID PANEL
Chol/HDL Ratio: 3.8 ratio (ref 0.0–5.0)
Cholesterol, Total: 225 mg/dL — ABNORMAL HIGH (ref 100–199)
HDL: 60 mg/dL (ref >39)
LDL Chol Calc (NIH): 139 mg/dL — ABNORMAL HIGH (ref 0–99)
Triglycerides: 148 mg/dL (ref 0–149)
VLDL Cholesterol Cal: 26 mg/dL (ref 5–40)

## 2021-05-18 LAB — CMP14+EGFR
ALT: 24 IU/L (ref 0–44)
AST: 22 IU/L (ref 0–40)
Albumin/Globulin Ratio: 1.7 (ref 1.2–2.2)
Albumin: 4.7 g/dL (ref 4.0–5.0)
Alkaline Phosphatase: 72 IU/L (ref 44–121)
BUN/Creatinine Ratio: 11 (ref 9–20)
BUN: 10 mg/dL (ref 6–20)
Bilirubin Total: 0.8 mg/dL (ref 0.0–1.2)
CO2: 26 mmol/L (ref 20–29)
Calcium: 9.8 mg/dL (ref 8.7–10.2)
Chloride: 97 mmol/L (ref 96–106)
Creatinine, Ser: 0.94 mg/dL (ref 0.76–1.27)
Globulin, Total: 2.8 g/dL (ref 1.5–4.5)
Glucose: 102 mg/dL — ABNORMAL HIGH (ref 65–99)
Potassium: 4.1 mmol/L (ref 3.5–5.2)
Sodium: 137 mmol/L (ref 134–144)
Total Protein: 7.5 g/dL (ref 6.0–8.5)
eGFR: 110 mL/min/{1.73_m2} (ref >59)

## 2021-05-18 LAB — TSH: TSH: 5.56 u[IU]/mL — ABNORMAL HIGH (ref 0.450–4.500)

## 2021-05-18 LAB — T4, FREE: Free T4: 1 ng/dL (ref 0.82–1.77)

## 2021-05-18 MED ORDER — ATORVASTATIN CALCIUM 20 MG PO TABS
20.0000 mg | ORAL_TABLET | Freq: Every day | ORAL | 6 refills | Status: DC
  Filled 2021-05-18: qty 30, 30d supply, fill #0

## 2021-05-18 MED ORDER — LEVOTHYROXINE SODIUM 100 MCG PO TABS
100.0000 ug | ORAL_TABLET | Freq: Every day | ORAL | 3 refills | Status: DC
  Filled 2021-05-18: qty 30, 30d supply, fill #0
  Filled 2021-07-28: qty 30, 30d supply, fill #1
  Filled 2021-10-13 – 2021-12-14 (×2): qty 30, 30d supply, fill #0
  Filled 2022-03-15: qty 30, 30d supply, fill #1

## 2021-05-19 ENCOUNTER — Other Ambulatory Visit: Payer: Self-pay

## 2021-06-27 ENCOUNTER — Other Ambulatory Visit: Payer: Self-pay

## 2021-06-28 ENCOUNTER — Other Ambulatory Visit: Payer: Self-pay

## 2021-06-30 ENCOUNTER — Other Ambulatory Visit: Payer: Self-pay

## 2021-07-12 ENCOUNTER — Telehealth: Payer: Self-pay | Admitting: Family Medicine

## 2021-07-28 ENCOUNTER — Other Ambulatory Visit: Payer: Self-pay

## 2021-07-29 ENCOUNTER — Other Ambulatory Visit: Payer: Self-pay

## 2021-08-26 ENCOUNTER — Other Ambulatory Visit: Payer: Self-pay

## 2021-08-29 ENCOUNTER — Other Ambulatory Visit: Payer: Self-pay

## 2021-09-07 ENCOUNTER — Other Ambulatory Visit: Payer: Self-pay

## 2021-09-14 ENCOUNTER — Other Ambulatory Visit (HOSPITAL_COMMUNITY): Payer: Self-pay

## 2021-09-14 ENCOUNTER — Other Ambulatory Visit: Payer: Self-pay

## 2021-10-13 ENCOUNTER — Other Ambulatory Visit: Payer: Self-pay | Admitting: Family Medicine

## 2021-10-13 ENCOUNTER — Other Ambulatory Visit: Payer: Self-pay

## 2021-10-13 DIAGNOSIS — I10 Essential (primary) hypertension: Secondary | ICD-10-CM

## 2021-10-13 MED ORDER — IBUPROFEN 800 MG PO TABS
800.0000 mg | ORAL_TABLET | Freq: Two times a day (BID) | ORAL | 0 refills | Status: AC | PRN
  Filled 2021-10-13 – 2021-12-14 (×2): qty 60, 30d supply, fill #0

## 2021-10-14 ENCOUNTER — Other Ambulatory Visit: Payer: Self-pay

## 2021-10-20 ENCOUNTER — Other Ambulatory Visit: Payer: Self-pay

## 2021-10-21 ENCOUNTER — Other Ambulatory Visit: Payer: Self-pay

## 2021-12-14 ENCOUNTER — Other Ambulatory Visit: Payer: Self-pay

## 2021-12-20 ENCOUNTER — Other Ambulatory Visit: Payer: Self-pay

## 2022-01-31 ENCOUNTER — Other Ambulatory Visit: Payer: Self-pay | Admitting: Family Medicine

## 2022-01-31 ENCOUNTER — Other Ambulatory Visit: Payer: Self-pay

## 2022-01-31 DIAGNOSIS — I10 Essential (primary) hypertension: Secondary | ICD-10-CM

## 2022-02-01 ENCOUNTER — Other Ambulatory Visit: Payer: Self-pay

## 2022-02-02 ENCOUNTER — Other Ambulatory Visit: Payer: Self-pay

## 2022-03-15 ENCOUNTER — Other Ambulatory Visit: Payer: Self-pay

## 2022-03-15 ENCOUNTER — Other Ambulatory Visit: Payer: Self-pay | Admitting: Pharmacist

## 2022-03-15 MED ORDER — OLMESARTAN MEDOXOMIL-HCTZ 40-25 MG PO TABS
1.0000 | ORAL_TABLET | Freq: Every day | ORAL | 0 refills | Status: DC
  Filled 2022-03-15: qty 30, 30d supply, fill #0

## 2022-03-15 MED ORDER — AMLODIPINE BESYLATE 10 MG PO TABS
10.0000 mg | ORAL_TABLET | Freq: Every day | ORAL | 0 refills | Status: DC
  Filled 2022-03-15: qty 30, 30d supply, fill #0

## 2022-03-16 ENCOUNTER — Other Ambulatory Visit: Payer: Self-pay

## 2022-05-08 ENCOUNTER — Other Ambulatory Visit: Payer: Self-pay

## 2022-05-08 ENCOUNTER — Other Ambulatory Visit: Payer: Self-pay | Admitting: Family Medicine

## 2022-05-08 DIAGNOSIS — E034 Atrophy of thyroid (acquired): Secondary | ICD-10-CM

## 2022-05-09 ENCOUNTER — Other Ambulatory Visit: Payer: Self-pay

## 2022-05-09 DIAGNOSIS — I10 Essential (primary) hypertension: Secondary | ICD-10-CM

## 2022-05-09 MED ORDER — AMLODIPINE BESYLATE 10 MG PO TABS
10.0000 mg | ORAL_TABLET | Freq: Every day | ORAL | 0 refills | Status: DC
  Filled 2022-05-09: qty 30, 30d supply, fill #0

## 2022-05-09 MED ORDER — LEVOTHYROXINE SODIUM 100 MCG PO TABS
100.0000 ug | ORAL_TABLET | Freq: Every day | ORAL | 0 refills | Status: DC
  Filled 2022-05-09: qty 30, 30d supply, fill #0

## 2022-05-09 MED ORDER — OLMESARTAN MEDOXOMIL-HCTZ 40-25 MG PO TABS
1.0000 | ORAL_TABLET | Freq: Every day | ORAL | 0 refills | Status: DC
  Filled 2022-05-09: qty 30, 30d supply, fill #0

## 2022-05-09 NOTE — Telephone Encounter (Signed)
Called pt and made appt for OV. Pt stated he has been out of medication for 3 days.  Advised pt that notation on prescription refill indicated he mist have office visit for further refills. Advised pt provider will be looking a prescriptions to refill. Pt concerned about that and reiterated that he is out of medication. Advised pt to call pharmacy later today to see if med was called in.   Last refill of levothyroxine was 05/18/21 #30 3 RF Last RF amlodipine and olmesartan-HCTZ was 03/15/22 #30 each.   Requested Prescriptions  Pending Prescriptions Disp Refills   levothyroxine (SYNTHROID) 100 MCG tablet 30 tablet 3    Sig: Take 1 tablet (100 mcg total) by mouth daily before breakfast.     Endocrinology:  Hypothyroid Agents Failed - 05/08/2022  1:38 PM      Failed - TSH in normal range and within 360 days    TSH  Date Value Ref Range Status  05/17/2021 5.560 (H) 0.450 - 4.500 uIU/mL Final         Failed - Valid encounter within last 12 months    Recent Outpatient Visits           12 months ago Hypothyroidism due to acquired atrophy of thyroid   [place], Enobong, MD   1 year ago Tobacco use   [place], Charlane Ferretti, MD   2 years ago OSA (obstructive sleep apnea)   [place], Enobong, MD   2 years ago Suspected sleep apnea   [place], Enobong, MD   3 years ago Essential hypertension   [place], Charlane Ferretti, MD       Future Appointments             In 3 months [place], Dionne Bucy, [place]             amLODipine (NORVASC) 10 MG tablet 30 tablet 0    Sig: Take 1 tablet (10 mg total) by mouth daily.     Cardiovascular: Calcium Channel Blockers 2 Failed - 05/08/2022  1:38 PM      Failed - Valid encounter within last 6 months    Recent Outpatient Visits            12 months ago Hypothyroidism due to acquired atrophy of thyroid   [place], Enobong, MD   1 year ago Tobacco use   [place], Charlane Ferretti, MD   2 years ago OSA (obstructive sleep apnea)   [place], Enobong, MD   2 years ago Suspected sleep apnea   [place], Enobong, MD   3 years ago Essential hypertension   [place], Enobong, MD       Future Appointments             In 3 months [place], Dionne Bucy, [place] BP in normal range    BP Readings from Last 1 Encounters:  03/22/20 139/88         Passed - Last Heart Rate in normal range    Pulse Readings from Last 1 Encounters:  03/22/20 83  olmesartan-hydrochlorothiazide (BENICAR HCT) 40-25 MG tablet 30 tablet 0    Sig: Take 1 tablet by mouth daily.     Cardiovascular: ARB + Diuretic Combos Failed - 05/08/2022  1:38 PM      Failed - K in normal range and within 180 days    Potassium  Date Value Ref Range Status  05/17/2021 4.1 3.5 - 5.2 mmol/L Final         Failed - Na in normal range and within 180 days    Sodium  Date Value Ref Range Status  05/17/2021 137 134 - 144 mmol/L Final         Failed - Cr in normal range and within 180 days    Creatinine, Ser  Date Value Ref Range Status  05/17/2021 0.94 0.76 - 1.27 mg/dL Final         Failed - eGFR is 10 or above and within 180 days    GFR calc Af Amer  Date Value Ref Range Status  03/22/2020 131 >59 mL/min/1.73 Final    Comment:    **[facility] currently reports eGFR in compliance with the current**   recommendations of the [employer]. [facility] will   update reporting as new guidelines are published from the NKF-ASN   Task force.    GFR calc non Af Amer  Date Value Ref Range  Status  03/22/2020 113 >59 mL/min/1.73 Final   eGFR  Date Value Ref Range Status  05/17/2021 110 >59 mL/min/1.73 Final         Failed - Valid encounter within last 6 months    Recent Outpatient Visits           12 months ago Hypothyroidism due to acquired atrophy of thyroid   [place] Charlott Rakes, MD   1 year ago Tobacco use   [place], Charlane Ferretti, MD   2 years ago OSA (obstructive sleep apnea)   [place], Enobong, MD   2 years ago Suspected sleep apnea   [place], MD   3 years ago Essential hypertension   [place], Enobong, MD       Future Appointments             In 3 months [place], [place] - Patient is not pregnant      Passed - Last BP in normal range    BP Readings from Last 1 Encounters:  03/22/20 139/88

## 2022-05-09 NOTE — Telephone Encounter (Signed)
Called pt and appt made for end of November. Pt stated he is out of his medication and is asking for refill to last until appt.  Pt also requested Ibuprofen refill.  Advised pt will send refill requests to provider to refill.

## 2022-05-10 ENCOUNTER — Other Ambulatory Visit: Payer: Self-pay

## 2022-07-11 ENCOUNTER — Other Ambulatory Visit: Payer: Self-pay | Admitting: Family Medicine

## 2022-07-11 ENCOUNTER — Other Ambulatory Visit: Payer: Self-pay

## 2022-07-11 DIAGNOSIS — E034 Atrophy of thyroid (acquired): Secondary | ICD-10-CM

## 2022-07-11 MED ORDER — LEVOTHYROXINE SODIUM 100 MCG PO TABS
100.0000 ug | ORAL_TABLET | Freq: Every day | ORAL | 0 refills | Status: DC
  Filled 2022-07-11: qty 30, 30d supply, fill #0

## 2022-07-11 MED ORDER — OLMESARTAN MEDOXOMIL-HCTZ 40-25 MG PO TABS
1.0000 | ORAL_TABLET | Freq: Every day | ORAL | 0 refills | Status: DC
  Filled 2022-07-11: qty 30, 30d supply, fill #0

## 2022-07-11 MED ORDER — AMLODIPINE BESYLATE 10 MG PO TABS
10.0000 mg | ORAL_TABLET | Freq: Every day | ORAL | 1 refills | Status: DC
  Filled 2022-07-11: qty 30, 30d supply, fill #0

## 2022-07-11 NOTE — Telephone Encounter (Signed)
Requested medication (s) are due for refill today: yes  Requested medication (s) are on the active medication list: yes  Last refill:  Synthroid and Benicar 05/09/22 #30 with 0 RF  Future visit scheduled: 08/10/22, seen 05/10/22  Notes to clinic:  Failed protocol of labs within 12 months, labs from 05/17/21, has upcoming appt, please assess.       Requested Prescriptions  Pending Prescriptions Disp Refills   levothyroxine (SYNTHROID) 100 MCG tablet 30 tablet 0    Sig: Take 1 tablet (100 mcg total) by mouth daily before breakfast.     Endocrinology:  Hypothyroid Agents Failed - 07/11/2022 12:48 PM      Failed - TSH in normal range and within 360 days    TSH  Date Value Ref Range Status  05/17/2021 5.560 (H) 0.450 - 4.500 uIU/mL Final         Failed - Valid encounter within last 12 months    Recent Outpatient Visits           1 year ago Hypothyroidism due to acquired atrophy of thyroid   [place] Charlott Rakes, MD   2 years ago Tobacco use   [place], Charlane Ferretti, MD   2 years ago OSA (obstructive sleep apnea)   [place], Enobong, MD   2 years ago Suspected sleep apnea   [place], Enobong, MD   3 years ago Essential hypertension   [place], Enobong, MD       Future Appointments             In 1 month [place], Dionne Bucy, [place]             olmesartan-hydrochlorothiazide (BENICAR HCT) 40-25 MG tablet 30 tablet 0    Sig: Take 1 tablet by mouth daily.     Cardiovascular: ARB + Diuretic Combos Failed - 07/11/2022 12:48 PM      Failed - K in normal range and within 180 days    Potassium  Date Value Ref Range Status  05/17/2021 4.1 3.5 - 5.2 mmol/L Final         Failed - Na in normal range and within 180 days    Sodium  Date Value Ref  Range Status  05/17/2021 137 134 - 144 mmol/L Final         Failed - Cr in normal range and within 180 days    Creatinine, Ser  Date Value Ref Range Status  05/17/2021 0.94 0.76 - 1.27 mg/dL Final         Failed - eGFR is 10 or above and within 180 days    GFR calc Af Amer  Date Value Ref Range Status  03/22/2020 131 >59 mL/min/1.73 Final    Comment:    **[facility] currently reports eGFR in compliance with the current**   recommendations of the [employer]. [facility] will   update reporting as new guidelines are published from the NKF-ASN   Task force.    GFR calc non Af Amer  Date Value Ref Range Status  03/22/2020 113 >59 mL/min/1.73 Final   eGFR  Date Value Ref Range Status  05/17/2021 110 >59 mL/min/1.73 Final         Failed - Valid encounter within last 6 months    Recent Outpatient Visits  1 year ago Hypothyroidism due to acquired atrophy of thyroid   [place], Charlane Ferretti, MD   2 years ago Tobacco use   [place], Charlane Ferretti, MD   2 years ago OSA (obstructive sleep apnea)   [place], Enobong, MD   2 years ago Suspected sleep apnea   [place], Enobong, MD   3 years ago Essential hypertension   [place], Enobong, MD       Future Appointments             In 1 month [place], Dionne Bucy, [place] - Patient is not pregnant      Passed - Last BP in normal range    BP Readings from Last 1 Encounters:  03/22/20 139/88         Signed Prescriptions Disp Refills   amLODipine (NORVASC) 10 MG tablet 90 tablet 1    Sig: Take 1 tablet (10 mg total) by mouth daily.     Cardiovascular: Calcium Channel Blockers 2 Failed - 07/11/2022 12:48 PM      Failed - Valid encounter within last 6 months     Recent Outpatient Visits           1 year ago Hypothyroidism due to acquired atrophy of thyroid   [place] Charlott Rakes, MD   2 years ago Tobacco use   [place], Charlane Ferretti, MD   2 years ago OSA (obstructive sleep apnea)   [place], Enobong, MD   2 years ago Suspected sleep apnea   [place], Enobong, MD   3 years ago Essential hypertension   [place], Enobong, MD       Future Appointments             In 1 month [place], Dionne Bucy, [place] BP in normal range    BP Readings from Last 1 Encounters:  03/22/20 139/88         Passed - Last Heart Rate in normal range    Pulse Readings from Last 1 Encounters:  03/22/20 83

## 2022-07-11 NOTE — Telephone Encounter (Signed)
Requested Prescriptions  Pending Prescriptions Disp Refills  . levothyroxine (SYNTHROID) 100 MCG tablet 30 tablet 0    Sig: Take 1 tablet (100 mcg total) by mouth daily before breakfast.     Endocrinology:  Hypothyroid Agents Failed - 07/11/2022 12:48 PM      Failed - TSH in normal range and within 360 days    TSH  Date Value Ref Range Status  05/17/2021 5.560 (H) 0.450 - 4.500 uIU/mL Final         Failed - Valid encounter within last 12 months    Recent Outpatient Visits          1 year ago Hypothyroidism due to acquired atrophy of thyroid   [place] Charlott Rakes, MD   2 years ago Tobacco use   [place], Charlane Ferretti, MD   2 years ago OSA (obstructive sleep apnea)   [place], Enobong, MD   2 years ago Suspected sleep apnea   [place], MD   3 years ago Essential hypertension   [place], Enobong, MD      Future Appointments            In 1 month [place], Dionne Bucy, PA-C [place]           . amLODipine (NORVASC) 10 MG tablet 30 tablet 0    Sig: Take 1 tablet (10 mg total) by mouth daily.     Cardiovascular: Calcium Channel Blockers 2 Failed - 07/11/2022 12:48 PM      Failed - Valid encounter within last 6 months    Recent Outpatient Visits          1 year ago Hypothyroidism due to acquired atrophy of thyroid   [place] Charlott Rakes, MD   2 years ago Tobacco use   [place], Charlane Ferretti, MD   2 years ago OSA (obstructive sleep apnea)   [place], Enobong, MD   2 years ago Suspected sleep apnea   [place], Enobong, MD   3 years ago Essential hypertension   [place], Enobong, MD      Future Appointments            In 1 month [place], Dionne Bucy, [place] BP in normal range    BP Readings from Last 1 Encounters:  03/22/20 139/88         Passed - Last Heart Rate in normal range    Pulse Readings from Last 1 Encounters:  03/22/20 83         . olmesartan-hydrochlorothiazide (BENICAR HCT) 40-25 MG tablet 30 tablet 0    Sig: Take 1 tablet by mouth daily.     Cardiovascular: ARB + Diuretic Combos Failed - 07/11/2022 12:48 PM      Failed - K in normal range and within 180 days    Potassium  Date Value Ref Range Status  05/17/2021 4.1 3.5 - 5.2 mmol/L Final         Failed - Na in normal range and within 180 days    Sodium  Date Value Ref Range Status  05/17/2021 137 134 - 144 mmol/L Final         Failed - Cr in normal range and within 180 days    Creatinine, Ser  Date Value Ref Range Status  05/17/2021 0.94 0.76 - 1.27 mg/dL Final         Failed - eGFR is 10 or above and within 180 days    GFR calc Af Amer  Date Value Ref Range Status  03/22/2020 131 >59 mL/min/1.73 Final    Comment:    **[facility] currently reports eGFR in compliance with the current**   recommendations of the [employer]. [facility] will   update reporting as new guidelines are published from the NKF-ASN   Task force.    GFR calc non Af Amer  Date Value Ref Range Status  03/22/2020 113 >59 mL/min/1.73 Final   eGFR  Date Value Ref Range Status  05/17/2021 110 >59 mL/min/1.73 Final         Failed - Valid encounter within last 6 months    Recent Outpatient Visits          1 year ago Hypothyroidism due to acquired atrophy of thyroid   [place] Charlott Rakes, MD   2 years ago Tobacco use   [place], Charlane Ferretti, MD   2 years ago OSA (obstructive sleep apnea)   [place], Enobong, MD   2 years ago Suspected sleep apnea   [place], MD   3 years ago Essential hypertension   [place], Enobong, MD      Future Appointments            In 1 month [place], Dionne Bucy, [place] - Patient is not pregnant      Passed - Last BP in normal range    BP Readings from Last 1 Encounters:  03/22/20 139/88

## 2022-07-11 NOTE — Telephone Encounter (Signed)
Requested Prescriptions  Pending Prescriptions Disp Refills  . levothyroxine (SYNTHROID) 100 MCG tablet 30 tablet 0    Sig: Take 1 tablet (100 mcg total) by mouth daily before breakfast.     Endocrinology:  Hypothyroid Agents Failed - 07/11/2022 12:48 PM      Failed - TSH in normal range and within 360 days    TSH  Date Value Ref Range Status  05/17/2021 5.560 (H) 0.450 - 4.500 uIU/mL Final         Failed - Valid encounter within last 12 months    Recent Outpatient Visits          1 year ago Hypothyroidism due to acquired atrophy of thyroid   [place] Charlott Rakes, MD   2 years ago Tobacco use   [place], Charlane Ferretti, MD   2 years ago OSA (obstructive sleep apnea)   [place], Enobong, MD   2 years ago Suspected sleep apnea   [place], MD   3 years ago Essential hypertension   [place], Enobong, MD      Future Appointments            In 1 month [place], Dionne Bucy, PA-C [place]           . amLODipine (NORVASC) 10 MG tablet 90 tablet 1    Sig: Take 1 tablet (10 mg total) by mouth daily.     Cardiovascular: Calcium Channel Blockers 2 Failed - 07/11/2022 12:48 PM      Failed - Valid encounter within last 6 months    Recent Outpatient Visits          1 year ago Hypothyroidism due to acquired atrophy of thyroid   [place] Charlott Rakes, MD   2 years ago Tobacco use   [place], Charlane Ferretti, MD   2 years ago OSA (obstructive sleep apnea)   [place], Enobong, MD   2 years ago Suspected sleep apnea   [place], Enobong, MD   3 years ago Essential hypertension   [place], Enobong, MD      Future Appointments            In 1 month [place], Dionne Bucy, [place] BP in normal range    BP Readings from Last 1 Encounters:  03/22/20 139/88         Passed - Last Heart Rate in normal range    Pulse Readings from Last 1 Encounters:  03/22/20 83         . olmesartan-hydrochlorothiazide (BENICAR HCT) 40-25 MG tablet 30 tablet 0    Sig: Take 1 tablet by mouth daily.     Cardiovascular: ARB + Diuretic Combos Failed - 07/11/2022 12:48 PM      Failed - K in normal range and within 180 days    Potassium  Date Value Ref Range Status  05/17/2021 4.1 3.5 - 5.2 mmol/L Final         Failed - Na in normal range and within 180 days    Sodium  Date Value Ref Range Status  05/17/2021 137 134 - 144 mmol/L Final         Failed - Cr in normal range and within 180 days    Creatinine, Ser  Date Value Ref Range Status  05/17/2021 0.94 0.76 - 1.27 mg/dL Final         Failed - eGFR is 10 or above and within 180 days    GFR calc Af Amer  Date Value Ref Range Status  03/22/2020 131 >59 mL/min/1.73 Final    Comment:    **[facility] currently reports eGFR in compliance with the current**   recommendations of the [employer]. [facility] will   update reporting as new guidelines are published from the NKF-ASN   Task force.    GFR calc non Af Amer  Date Value Ref Range Status  03/22/2020 113 >59 mL/min/1.73 Final   eGFR  Date Value Ref Range Status  05/17/2021 110 >59 mL/min/1.73 Final         Failed - Valid encounter within last 6 months    Recent Outpatient Visits          1 year ago Hypothyroidism due to acquired atrophy of thyroid   [place] Charlott Rakes, MD   2 years ago Tobacco use   [place], Charlane Ferretti, MD   2 years ago OSA (obstructive sleep apnea)   [place], Enobong, MD   2 years ago Suspected sleep apnea   [place], MD   3 years ago Essential hypertension   [place], Enobong, MD      Future Appointments            In 1 month [place], Dionne Bucy, [place] - Patient is not pregnant      Passed - Last BP in normal range    BP Readings from Last 1 Encounters:  03/22/20 139/88

## 2022-07-12 ENCOUNTER — Other Ambulatory Visit: Payer: Self-pay

## 2022-07-13 ENCOUNTER — Other Ambulatory Visit: Payer: Self-pay | Admitting: Pharmacist

## 2022-07-13 ENCOUNTER — Other Ambulatory Visit (HOSPITAL_BASED_OUTPATIENT_CLINIC_OR_DEPARTMENT_OTHER): Payer: Self-pay

## 2022-07-13 DIAGNOSIS — K219 Gastro-esophageal reflux disease without esophagitis: Secondary | ICD-10-CM

## 2022-07-13 DIAGNOSIS — E034 Atrophy of thyroid (acquired): Secondary | ICD-10-CM

## 2022-07-13 DIAGNOSIS — E7800 Pure hypercholesterolemia, unspecified: Secondary | ICD-10-CM

## 2022-07-13 MED ORDER — ATORVASTATIN CALCIUM 20 MG PO TABS: 20.0000 mg | ORAL_TABLET | Freq: Every day | ORAL | 0 refills | Status: DC

## 2022-07-13 MED ORDER — OMEPRAZOLE 20 MG PO CPDR: 20.0000 mg | DELAYED_RELEASE_CAPSULE | Freq: Every day | ORAL | 0 refills | Status: AC

## 2022-07-13 MED ORDER — OLMESARTAN MEDOXOMIL-HCTZ 40-25 MG PO TABS: 1.0000 | ORAL_TABLET | Freq: Every day | ORAL | 0 refills | Status: DC

## 2022-07-13 MED ORDER — AMLODIPINE BESYLATE 10 MG PO TABS: 10.0000 mg | ORAL_TABLET | Freq: Every day | ORAL | 0 refills | Status: DC

## 2022-07-13 MED ORDER — LEVOTHYROXINE SODIUM 100 MCG PO TABS: 100.0000 ug | ORAL_TABLET | Freq: Every day | ORAL | 0 refills | Status: DC

## 2022-08-10 ENCOUNTER — Ambulatory Visit: Payer: Self-pay | Admitting: Physician Assistant

## 2022-08-31 ENCOUNTER — Other Ambulatory Visit: Payer: Self-pay

## 2022-10-05 ENCOUNTER — Other Ambulatory Visit (HOSPITAL_COMMUNITY): Payer: Self-pay

## 2022-10-25 ENCOUNTER — Other Ambulatory Visit: Payer: Self-pay

## 2022-10-25 ENCOUNTER — Emergency Department (HOSPITAL_COMMUNITY)
Admission: EM | Admit: 2022-10-25 | Discharge: 2022-10-25 | Disposition: A | Discharge status: Home / Self Care | Payer: 59 | Attending: Emergency Medicine | Admitting: Emergency Medicine

## 2022-10-25 ENCOUNTER — Encounter (HOSPITAL_COMMUNITY): Payer: Self-pay

## 2022-10-25 DIAGNOSIS — I10 Essential (primary) hypertension: Secondary | ICD-10-CM | POA: Diagnosis not present

## 2022-10-25 DIAGNOSIS — Z79899 Other long term (current) drug therapy: Secondary | ICD-10-CM | POA: Diagnosis not present

## 2022-10-25 DIAGNOSIS — R059 Cough, unspecified: Secondary | ICD-10-CM | POA: Diagnosis present

## 2022-10-25 DIAGNOSIS — J111 Influenza due to unidentified influenza virus with other respiratory manifestations: Secondary | ICD-10-CM | POA: Insufficient documentation

## 2022-10-25 NOTE — Discharge Instructions (Signed)
Viral Illness TREATMENT  Treatment is directed at relieving symptoms. There is no cure. Antibiotics are not effective because the infection is caused by a virus, not by bacteria. Treatment may include:  Increased fluid intake. Sports drinks offer valuable electrolytes, sugars, and fluids.  Breathing heated mist or steam (vaporizer or shower).  Eating chicken soup or other clear broths and maintaining good nutrition.  Getting plenty of rest.  Using gargles or lozenges for comfort.  Increasing usage of your inhaler if you have asthma.  Return to work when your temperature has returned to normal.  Gargle warm salt water and spit it out for sore throat. Take benadryl to decrease sinus secretions. Continue to alternate between Tylenol and ibuprofen for pain and fever control.  Follow Up: Follow up with your primary care doctor in 5-7 days for recheck of ongoing symptoms.  Return to emergency department for emergent changing or worsening of symptoms.

## 2022-10-25 NOTE — ED Provider Notes (Signed)
[facility] Provider Note   CSN: [id number] Arrival date & time: 10/25/22  2009     History  Chief Complaint  Patient presents with   Flu like symptoms    KROIX GOETHALS is a 37 y.o. male with PMH HTN and Graves disease who presents to ED c/o dry cough, congestion, fatigue, body aches, fever, and frontal headache x 2 days. Pt states his daughter was diagnosed with influenza. He states he has been taking over the counter medications at home such as Tylenol, ibuprofen, Theraflu, and DayQuil which do help his symptoms. He states overall he feels like symptoms are improving. He denies chest pain, shortness of breath, sore throat, abdominal pain, nausea, vomiting, or diarrhea. He states he came to ED today because he needs a work note but otherwise does not desire further evaluation today.     Home Medications Prior to Admission medications   Medication Sig Start Date End Date Taking? Authorizing Provider  amLODipine (NORVASC) 10 MG tablet Take 1 tablet (10 mg total) by mouth daily. 07/13/22   Charlott Rakes, MD  atorvastatin (LIPITOR) 20 MG tablet Take 1 tablet (20 mg total) by mouth daily. 07/13/22   Charlott Rakes, MD  ibuprofen (ADVIL) 800 MG tablet Take 1 tablet (800 mg total) by mouth every 12 (twelve) hours as needed. 10/13/21   Charlott Rakes, MD  levothyroxine (SYNTHROID) 100 MCG tablet Take 1 tablet (100 mcg total) by mouth daily before breakfast. 07/13/22   Charlott Rakes, MD  methocarbamol (ROBAXIN) 500 MG tablet Take 1 tablet by mouth 2 (two) times daily as needed for muscle spasms.  07/11/18   [provider]  Misc. Devices MISC CPAP on 19 cm of water.  Diagnosis- Obstructive sleep apnea. Medium Wide size Philips Respironics Full Face  Mask Dreamwear mask and heated humidification. 03/01/20   Charlott Rakes, MD  Multiple Vitamins-Minerals (MULTIVITAMIN ADULT PO) Take 1 tablet by mouth daily.    [provider]   nicotine (NICODERM CQ) 7 mg/24hr patch Place 1 patch (7 mg total) onto the skin daily. 06/22/20   Charlott Rakes, MD  olmesartan-hydrochlorothiazide (BENICAR HCT) 40-25 MG tablet Take 1 tablet by mouth daily. 07/13/22   Charlott Rakes, MD  omeprazole (PRILOSEC) 20 MG capsule Take 1 capsule (20 mg total) by mouth daily. 07/13/22   Charlott Rakes, MD      Allergies    Patient has no known allergies.    Review of Systems   Review of Systems  All other systems reviewed and are negative.   Physical Exam Updated Vital Signs BP 121/82 (BP Location: Right Arm)   Pulse (!) 119   Temp 98.6 F (37 C) (Oral)   Resp 16   SpO2 100%  Physical Exam Vitals and nursing note reviewed.  Constitutional:      General: He is not in acute distress.    Appearance: Normal appearance. He is ill-appearing.  HENT:     Head: Normocephalic and atraumatic.     Nose: Congestion present.     Mouth/Throat:     Mouth: Mucous membranes are moist.     Pharynx: Oropharynx is clear. No oropharyngeal exudate or posterior oropharyngeal erythema.  Eyes:     General: No scleral icterus.       Right eye: No discharge.        Left eye: No discharge.     Conjunctiva/sclera: Conjunctivae normal.  Cardiovascular:     Rate and Rhythm: Regular rhythm. Tachycardia  present.     Heart sounds: Normal heart sounds. No murmur heard. Pulmonary:     Effort: Pulmonary effort is normal. No respiratory distress.     Breath sounds: Normal breath sounds. No stridor. No wheezing, rhonchi or rales.  Abdominal:     General: Abdomen is flat. There is no distension.     Palpations: Abdomen is soft.     Tenderness: There is no abdominal tenderness. There is no guarding or rebound.  Musculoskeletal:        General: Normal range of motion.     Cervical back: Normal range of motion and neck supple. No rigidity or tenderness.     Right lower leg: No edema.     Left lower leg: No edema.  Skin:    General: Skin is warm and dry.      Capillary Refill: Capillary refill takes less than 2 seconds.  Neurological:     General: No focal deficit present.     Mental Status: He is alert and oriented to person, place, and time.     Motor: No weakness.     Gait: Gait normal.  Psychiatric:        Behavior: Behavior normal.     ED Results / Procedures / Treatments   Labs (all labs ordered are listed, but only abnormal results are displayed) Labs Reviewed - No data to display  EKG None  Radiology No results found.  Procedures Procedures    Medications Ordered in ED Medications - No data to display  ED Course/ Medical Decision Making/ A&P                             Medical Decision Making  Medical Decision Making:   ASHDEN POTEETE is a 37 y.o. male who presented to the ED today with fever, cough, congestion as detailed above.      Complete initial physical exam performed, notably the patient  was hemodynamically stable in no acute distress.  Posterior oropharynx illuminated and without obvious swelling or deformity.  Patient is without neck stiffness.He is tachycardic. Abdominal exam is benign.     Reviewed and confirmed nursing documentation for past medical history, family history, social history.    Initial Assessment:   With the patient's presentation, most likely diagnosis is influenza. Other diagnoses were considered including (but not limited to) COVID, RSV, peritonsillar abscess, retropharyngeal abscess, pneumonia. These are considered less likely due to history of present illness and physical exam findings.   This is most consistent with an acute complicated illness Considered meningitis, however patient's symptoms, vital signs, physical exam findings including lack of meningismus seem grossly less consistent at this time. Initial Plan:  Screening labs including CBC and Metabolic panel to evaluate for infectious or metabolic etiology of disease. Declined by patient. Viral screening including  COVID/flu testing to evaluate for common viral etiologies that need to be tracked. Declined by patient. CXR to evaluate for structural/infectious intrathoracic pathology. Declined by patient.  EKG declined by patient Empiric treatment with antipyretics. Pt took Motrin PTA. He declines further treatment.  Offered prescription medication to treat pt's symptoms but he declined this including Tamiflu and other anti-virals Objective evaluation as reviewed   Initial Study Results:   Laboratory & Radiology:  Declined by patient  Pt with symptoms as above and positive influenza contact. He is tolerating PO intake at home and controlling his symptoms with over the counter medication. He states his employer  requires work note but he does not desire further workup including but not limited to viral swabs, chest x-ray, lab work, or EKG. Patient is currently demonstrating decision making capacity and is stable for outpatient care and management with no indication for hospitalization or transfer at this time though evaluation is limited due to pt declining further workup.  Disposition:  Based on the above discussion, I will discharge patient but he is given very strict ED return precautions. I recommended further evaluation, however, he declined this.   Patient educated about specific return precautions for given chief complaint and symptoms.  Patient/family educated about follow-up with PCP.      Patient/family expressed understanding of return precautions and need for follow-up. Patient spoken to regarding appropriate follow up. All education provided in verbal form with additional information in written form. Time was allowed for answering of patient questions. Patient discharged.    Emergency Department Medication Summary:   Medications - No data to display         Clinical Impression:  1. Influenza      Discharge           Final Clinical Impression(s) / ED Diagnoses Final diagnoses:   Influenza    Rx / DC Orders ED Discharge Orders     None         Turner Daniels 10/25/22 2122    Charlesetta Shanks, MD 11/02/22 1549

## 2022-10-25 NOTE — ED Triage Notes (Signed)
Headache, dry cough, fever for a few days. Daughter has influenza.

## 2023-05-16 ENCOUNTER — Other Ambulatory Visit (HOSPITAL_COMMUNITY)
Admission: RE | Admit: 2023-05-16 | Discharge: 2023-05-16 | Disposition: A | Discharge status: Home / Self Care | Payer: 59 | Source: Ambulatory Visit | Attending: Family Medicine | Admitting: Family Medicine

## 2023-05-16 ENCOUNTER — Telehealth: Payer: Self-pay

## 2023-05-16 ENCOUNTER — Other Ambulatory Visit: Payer: Self-pay

## 2023-05-16 ENCOUNTER — Encounter: Payer: Self-pay | Admitting: Family Medicine

## 2023-05-16 ENCOUNTER — Telehealth: Payer: Self-pay | Admitting: Family Medicine

## 2023-05-16 ENCOUNTER — Ambulatory Visit: Payer: 59 | Attending: Family Medicine | Admitting: Family Medicine

## 2023-05-16 VITALS — BP 130/85 | HR 78 | Ht 72.0 in | Wt 252.8 lb

## 2023-05-16 DIAGNOSIS — R400 Somnolence: Secondary | ICD-10-CM

## 2023-05-16 DIAGNOSIS — Z202 Contact with and (suspected) exposure to infections with a predominantly sexual mode of transmission: Secondary | ICD-10-CM | POA: Insufficient documentation

## 2023-05-16 DIAGNOSIS — J343 Hypertrophy of nasal turbinates: Secondary | ICD-10-CM

## 2023-05-16 DIAGNOSIS — E7800 Pure hypercholesterolemia, unspecified: Secondary | ICD-10-CM

## 2023-05-16 DIAGNOSIS — E039 Hypothyroidism, unspecified: Secondary | ICD-10-CM | POA: Diagnosis not present

## 2023-05-16 DIAGNOSIS — Z634 Disappearance and death of family member: Secondary | ICD-10-CM

## 2023-05-16 DIAGNOSIS — G4733 Obstructive sleep apnea (adult) (pediatric): Secondary | ICD-10-CM

## 2023-05-16 DIAGNOSIS — F17210 Nicotine dependence, cigarettes, uncomplicated: Secondary | ICD-10-CM | POA: Diagnosis not present

## 2023-05-16 DIAGNOSIS — I10 Essential (primary) hypertension: Secondary | ICD-10-CM

## 2023-05-16 MED ORDER — ARMODAFINIL 50 MG PO TABS: 50.0000 mg | ORAL_TABLET | Freq: Every day | ORAL | 1 refills | Status: DC

## 2023-05-16 MED ORDER — ATORVASTATIN CALCIUM 20 MG PO TABS
20.0000 mg | ORAL_TABLET | Freq: Every day | ORAL | 1 refills | Status: DC
Start: 2023-05-16 — End: 2023-05-16
  Filled 2023-05-16: qty 30, 30d supply, fill #0

## 2023-05-16 MED ORDER — SPIRONOLACTONE 25 MG PO TABS: 25.0000 mg | ORAL_TABLET | Freq: Every day | ORAL | 1 refills | Status: DC

## 2023-05-16 MED ORDER — OLMESARTAN MEDOXOMIL-HCTZ 40-25 MG PO TABS
1.0000 | ORAL_TABLET | Freq: Every day | ORAL | 1 refills | Status: DC
Start: 2023-05-16 — End: 2023-05-16
  Filled 2023-05-16: qty 30, 30d supply, fill #0

## 2023-05-16 MED ORDER — OLMESARTAN MEDOXOMIL-HCTZ 40-25 MG PO TABS: 1.0000 | ORAL_TABLET | Freq: Every day | ORAL | 1 refills | Status: DC

## 2023-05-16 MED ORDER — ARMODAFINIL 50 MG PO TABS
50.0000 mg | ORAL_TABLET | Freq: Every day | ORAL | 1 refills | Status: DC
  Filled 2023-05-16: qty 30, 30d supply, fill #0

## 2023-05-16 MED ORDER — ATORVASTATIN CALCIUM 20 MG PO TABS: 20.0000 mg | ORAL_TABLET | Freq: Every day | ORAL | 1 refills | Status: DC

## 2023-05-16 MED ORDER — METRONIDAZOLE 500 MG PO TABS
2000.0000 mg | ORAL_TABLET | Freq: Once | ORAL | 0 refills | Status: DC
Start: 2023-05-16 — End: 2023-05-16
  Filled 2023-05-16: qty 4, 1d supply, fill #0

## 2023-05-16 MED ORDER — FLUTICASONE PROPIONATE 50 MCG/ACT NA SUSP
2.0000 | Freq: Every day | NASAL | 6 refills | Status: DC
  Filled 2023-05-16: qty 16, 30d supply, fill #0

## 2023-05-16 MED ORDER — SPIRONOLACTONE 25 MG PO TABS
25.0000 mg | ORAL_TABLET | Freq: Every day | ORAL | 1 refills | Status: DC
Start: 2023-05-16 — End: 2023-05-16
  Filled 2023-05-16: qty 30, 30d supply, fill #0

## 2023-05-16 MED ORDER — METRONIDAZOLE 500 MG PO TABS: 2000.0000 mg | ORAL_TABLET | Freq: Once | ORAL | 0 refills | Status: AC

## 2023-05-16 MED ORDER — FLUTICASONE PROPIONATE 50 MCG/ACT NA SUSP: 2.0000 | Freq: Every day | NASAL | 6 refills | Status: AC

## 2023-05-16 NOTE — Progress Notes (Signed)
Subjective:  Patient ID: Darren Barajas, male    DOB: September 16, 1985  Age: 37 y.o. MRN: [id number]  CC: Medical Management of Chronic Issues   HPI Darren Barajas is a 37 y.o. year old male with a history of migraines, obstructive sleep apnea (on CPAP), hypertension, hypothyroidism, hyperlipidemia seen for chronic disease management   Interval History: Discussed the use of AI scribe software for clinical note transcription with the patient, who gave verbal consent to proceed.  Last visit was in 04/2021. He presents with excessive daytime sleepiness and falling asleep at work.  He would like a medication such as Adderall to keep him awake.  The patient reports non-compliance with CPAP therapy due to loss of the machine and previous discomfort with the mask. He has ordered an oral device online as an alternative but is awaiting its arrival. The patient also reports non-compliance with thyroid medication due to personal issues. The patient also has nasal turbinate obstruction, which is worsening his sleep apnea. He has seen an ENT specialist who suggested potential surgery to address this and he is yet to follow-up according to recommendation of 6 weeks since his last visit in 08/2022.  The patient is also concerned about  side effect of his blood pressure medication, amlodipine, and reports leg swelling when taking this medication.  He would like an alternative that will not cause erectile dysfunction.  The patient has been experiencing emotional distress due to the recent deaths of his father and brother. He reports using alcohol as a coping mechanism, although he is trying to reduce his intake. The patient is planning to take a 30-day leave from work to focus on her mental health and sleep apnea management.  Also thinking of inpatient rehab.   His sexual partner was recently diagnosed with trichomonas and he would like to be tested.  He is asymptomatic.    Past Medical History:  Diagnosis  Date   Anginal pain (HCC)    Chronic bronchitis (HCC)    "get it q couple months; q couple weeks" (04/07/2015)   Graves' disease    treated w/radioactive ioding ablation in 2001   Headache    "weekly" (04/07/2015)   Hypertension    Not currently taking medications    Past Surgical History:  Procedure Laterality Date   NO PAST SURGERIES      Family History  Problem Relation Age of Onset   Hyperthyroidism Mother    Hyperthyroidism Maternal Uncle     Social History   Socioeconomic History   Marital status: Single    Spouse name: Not on file   Number of children: Not on file   Years of education: Not on file   Highest education level: Not on file  Occupational History   Not on file  Tobacco Use   Smoking status: Every Day    Current packs/day: 1.00    Average packs/day: 1 pack/day for 10.0 years (10.0 ttl pk-yrs)    Types: Cigarettes   Smokeless tobacco: Never  Vaping Use   Vaping status: Never Used  Substance and Sexual Activity   Alcohol use: Yes    Alcohol/week: 36.0 standard drinks of alcohol    Types: 36 Cans of beer per week    Comment: 04/07/2015 "12 pack beer 2-3 days/wk"   Drug use: Yes    Types: MDMA (Ecstacy), Cocaine, Marijuana    Comment: 04/07/2015 "last used MDMA, Xanax 04/06/2015; haven't used coke in the last couple months"   Sexual activity: Yes  Other Topics Concern   Not on file  Social History Narrative   Works as Landscape architect Strain: Not on [employer] Insecurity: Not on file  Transportation Needs: Not on file  Physical Activity: Not on file  Stress: Not on file  Social Connections: Unknown (01/11/2022)   Received from [facility]   Social Network    Social Network: Not on file    No Known Allergies  Outpatient Medications Prior to Visit  Medication Sig Dispense Refill   ibuprofen (ADVIL) 800 MG tablet Take 1 tablet (800 mg total) by mouth every 12 (twelve) hours as needed.  60 tablet 0   levothyroxine (SYNTHROID) 100 MCG tablet Take 1 tablet (100 mcg total) by mouth daily before breakfast. 30 tablet 0   Misc. Devices MISC CPAP on 19 cm of water.  Diagnosis- Obstructive sleep apnea. Medium Wide size Philips Respironics Full Face  Mask Dreamwear mask and heated humidification. 1 each 0   Multiple Vitamins-Minerals (MULTIVITAMIN ADULT PO) Take 1 tablet by mouth daily.     omeprazole (PRILOSEC) 20 MG capsule Take 1 capsule (20 mg total) by mouth daily. 30 capsule 0   amLODipine (NORVASC) 10 MG tablet Take 1 tablet (10 mg total) by mouth daily. 30 tablet 0   atorvastatin (LIPITOR) 20 MG tablet Take 1 tablet (20 mg total) by mouth daily. 30 tablet 0   olmesartan-hydrochlorothiazide (BENICAR HCT) 40-25 MG tablet Take 1 tablet by mouth daily. 30 tablet 0   methocarbamol (ROBAXIN) 500 MG tablet Take 1 tablet by mouth 2 (two) times daily as needed for muscle spasms.   3   nicotine (NICODERM CQ) 7 mg/24hr patch Place 1 patch (7 mg total) onto the skin daily. 28 patch 2   No facility-administered medications prior to visit.     ROS Review of Systems  Constitutional:  Negative for activity change and appetite change.  HENT:  Positive for congestion. Negative for sinus pressure and sore throat.   Respiratory:  Negative for chest tightness, shortness of breath and wheezing.   Cardiovascular:  Negative for chest pain and palpitations.  Gastrointestinal:  Negative for abdominal distention, abdominal pain and constipation.  Genitourinary: Negative.   Musculoskeletal: Negative.   Psychiatric/Behavioral:  Positive for dysphoric mood. Negative for behavioral problems.     Objective:  BP 130/85   Pulse 78   Ht 6' (1.829 m)   Wt 252 lb 12.8 oz (114.7 kg)   SpO2 98%   BMI 34.29 kg/m      05/16/2023    2:51 PM 10/25/2022    8:26 PM 03/22/2020    9:42 AM  BP/Weight  Systolic BP 130 121 139  Diastolic BP 85 82 88  Wt. (Lbs) 252.8  259.2  BMI 34.29 kg/m2  35.15 kg/m2       Physical Exam Constitutional:      Appearance: He is well-developed.  Cardiovascular:     Rate and Rhythm: Normal rate.     Heart sounds: Normal heart sounds. No murmur heard. Pulmonary:     Effort: Pulmonary effort is normal.     Breath sounds: Normal breath sounds. No wheezing or rales.  Chest:     Chest wall: No tenderness.  Abdominal:     General: Bowel sounds are normal. There is no distension.     Palpations: Abdomen is soft. There is no mass.     Tenderness: There is no abdominal tenderness.  Musculoskeletal:  General: Normal range of motion.     Right lower leg: No edema.     Left lower leg: No edema.  Neurological:     Mental Status: He is alert and oriented to person, place, and time.  Psychiatric:     Comments: Dysphoric mood        Latest Ref Rng & Units 05/17/2021    8:34 AM 03/22/2020   10:20 AM 01/05/2020    4:10 PM  CMP  Glucose 65 - 99 mg/dL 027  253  89   BUN 6 - 20 mg/dL 10  14  9    Creatinine 0.76 - 1.27 mg/dL 6.64  4.03  4.74   Sodium 134 - 144 mmol/L 137  137  135   Potassium 3.5 - 5.2 mmol/L 4.1  3.8  3.6   Chloride 96 - 106 mmol/L 97  97  97   CO2 20 - 29 mmol/L 26  23  25    Calcium 8.7 - 10.2 mg/dL 9.8  9.5  9.9   Total Protein 6.0 - 8.5 g/dL 7.5  7.6    Total Bilirubin 0.0 - 1.2 mg/dL 0.8  0.6    Alkaline Phos 44 - 121 IU/L 72  67    AST 0 - 40 IU/L 22  19    ALT 0 - 44 IU/L 24  28      Lipid Panel     Component Value Date/Time   CHOL 225 (H) 05/17/2021 0834   TRIG 148 05/17/2021 0834   HDL 60 05/17/2021 0834   CHOLHDL 3.8 05/17/2021 0834   LDLCALC 139 (H) 05/17/2021 0834    CBC    Component Value Date/Time   WBC 5.0 02/24/2019 1215   RBC 4.89 02/24/2019 1215   HGB 14.9 02/24/2019 1215   HCT 44.1 02/24/2019 1215   PLT 256 02/24/2019 1215   MCV 90.2 02/24/2019 1215   MCH 30.5 02/24/2019 1215   MCHC 33.8 02/24/2019 1215   RDW 12.8 02/24/2019 1215   LYMPHSABS 2.1 02/24/2019 1215   MONOABS 0.8 02/24/2019 1215    EOSABS 0.1 02/24/2019 1215   BASOSABS 0.0 02/24/2019 1215    No results found for: "HGBA1C"  Assessment & Plan:      Obstructive Sleep Apnea Excessive daytime sleepiness and falling asleep at work due to non-compliance with CPAP therapy. Patient has lost his CPAP machine and is awaiting a custom oral device. Nasal obstruction due to enlarged septum may be contributing to sleep apnea. -Coordinate with case manager to facilitate obtaining a new CPAP machine. -Recommend follow-up with ENT for potential nasal surgery to improve airway patency. -Prescribe Nuvigil to help with daytime wakefulness.  Hypertension Patient has been compliant with antihypertensive medications, but reports leg swelling with amlodipine and concerns about erectile dysfunction. -Discontinue amlodipine and start spironolactone as a replacement antihypertensive. -Check potassium levels due to risk of hyperkalemia with spironolactone.  Bereavement Recent bereavements (father and brother) and increased alcohol consumption for self-medication. Patient reports plans to attend rehab for mental health. -Encourage grief counseling and mental health treatment. -Advise against self-medication with alcohol and encourage healthy coping mechanisms.  Work Leave Patient is seeking a 30-day leave from work due to sleep issues and mental health concerns. -Complete necessary paperwork for work leave, starting from the following day.  Hypothyroidism Patient admits to inconsistent use of thyroid medication. -Encourage regular use of thyroid medication. -Will check thyroid labs today but will make no regimen changes if abnormal due to medication nonadherence  Edema  Patient reports leg swelling, possibly related to amlodipine use. -Monitor for improvement in edema with discontinuation of amlodipine  -Encouraged to comply with a low-sodium diet, elevate feet, use compression stockings  Nasal turbinate hypertrophy He sounds  congested Prescribed Flonase -Advised to schedule follow-up appointment with ENT  Exposure to trichomonas -Will treat presumptively -Check urine sample.          Meds ordered this encounter  Medications   DISCONTD: Armodafinil 50 MG tablet    Sig: Take 1 tablet (50 mg total) by mouth daily.    Dispense:  30 tablet    Refill:  1   DISCONTD: fluticasone (FLONASE) 50 MCG/ACT nasal spray    Sig: Place 2 sprays into both nostrils daily.    Dispense:  16 g    Refill:  6   DISCONTD: atorvastatin (LIPITOR) 20 MG tablet    Sig: Take 1 tablet (20 mg total) by mouth daily.    Dispense:  90 tablet    Refill:  1   DISCONTD: olmesartan-hydrochlorothiazide (BENICAR HCT) 40-25 MG tablet    Sig: Take 1 tablet by mouth daily.    Dispense:  90 tablet    Refill:  1   DISCONTD: spironolactone (ALDACTONE) 25 MG tablet    Sig: Take 1 tablet (25 mg total) by mouth daily.    Dispense:  90 tablet    Refill:  1    Discontinue Amlodipine   DISCONTD: metroNIDAZOLE (FLAGYL) 500 MG tablet    Sig: Take 4 tablets (2,000 mg total) by mouth once for 1 dose.    Dispense:  4 tablet    Refill:  0   DISCONTD: Armodafinil 50 MG tablet    Sig: Take 1 tablet (50 mg total) by mouth daily.    Dispense:  30 tablet    Refill:  1   atorvastatin (LIPITOR) 20 MG tablet    Sig: Take 1 tablet (20 mg total) by mouth daily.    Dispense:  90 tablet    Refill:  1   fluticasone (FLONASE) 50 MCG/ACT nasal spray    Sig: Place 2 sprays into both nostrils daily.    Dispense:  16 g    Refill:  6   metroNIDAZOLE (FLAGYL) 500 MG tablet    Sig: Take 4 tablets (2,000 mg total) by mouth once for 1 dose.    Dispense:  4 tablet    Refill:  0   olmesartan-hydrochlorothiazide (BENICAR HCT) 40-25 MG tablet    Sig: Take 1 tablet by mouth daily.    Dispense:  90 tablet    Refill:  1   spironolactone (ALDACTONE) 25 MG tablet    Sig: Take 1 tablet (25 mg total) by mouth daily.    Dispense:  90 tablet    Refill:  1     Discontinue Amlodipine   Armodafinil 50 MG tablet    Sig: Take 1 tablet (50 mg total) by mouth daily.    Dispense:  30 tablet    Refill:  1    Follow-up: Return in about 3 months (around 08/15/2023).       Hoy Register, MD, FAAFP. [facility] [place], [place] [phone]   05/16/2023, 4:58 PM

## 2023-05-16 NOTE — Patient Instructions (Signed)
Trichomoniasis Trichomoniasis is a sexually transmitted infection (STI). Many people with trichomoniasis do not have any symptoms (are asymptomatic) or have only minimal symptoms. Untreated trichomoniasis can last from months to years. This condition is treated with medicine. What are the causes? This condition is caused by a parasite called Trichomonas vaginalis and is transmitted during sexual contact. What increases the risk? The following factors may make you more likely to develop this condition: Having unprotected sex. Having sex with a partner who has trichomoniasis. Having multiple sexual partners. Having had previous trichomoniasis infections or other STIs. What are the signs or symptoms? In females, symptoms of trichomoniasis include: Itching, burning, redness, or soreness in the genital area. Discomfort while urinating. Abnormal vaginal discharge that is clear, white, gray, or yellow-green and foamy and has an unusual fishy odor. In males, symptoms of trichomoniasis include: Discharge from the penis. Burning after urination or ejaculation. Itching or discomfort inside the penis. How is this diagnosed? This condition is diagnosed based on tests. To perform a test, your health care provider will do one of the following: Ask you to provide a urine sample. Take a sample of discharge. The sample may be taken from the vagina or cervix in females and from the urethra in males. Your health care provider may use a swab to collect the sample. Your health care provider may test you for other STIs, including human immunodeficiency virus (HIV). How is this treated?  This condition is treated with medicines such as metronidazole or tinidazole. These are called antimicrobial medicines, and they are taken by mouth (orally). Your sexual partner or partners also need to be tested and treated. If they have the infection and are not treated, you will likely get reinfected. If you plan to become  pregnant or think you may be pregnant, tell your health care provider right away. Some medicines that are used to treat the infection should not be taken during pregnancy. Your health care provider may recommend over-the-counter medicines or creams to help relieve itching or irritation. You may be tested for the infection again 3 months after treatment. Follow these instructions at home: Medicines Take over-the-counter and prescription medicines only as told by your health care provider. Take your antimicrobial medicine as told by your health care provider. Do not stop taking it even if you start to feel better. Use creams as told by your health care provider. General instructions Do not have sex until after you finish your medicine and your symptoms have resolved. Do not wear tampons while you have the infection (if you are male). Talk with your sexual partner or partners about any symptoms that either of you may have, as well as any history of STIs. Keep all follow-up visits. This is important. How is this prevented?  Use condoms every time you have sex. Using condoms correctly and consistently can help protect against STIs. Do not have sexual contact if you have symptoms of trichomoniasis or another STI. Avoid having multiple sexual partners. Get tested for STIs before you have sex with a partner. Ask all partners to do the same. Do not douche (if you are male). Douching may increase your risk for getting STIs due to the removal of good bacteria in the vagina. Contact a health care provider if: You still have symptoms after you finish your medicine. You develop a rash. You plan to become pregnant or think you may be pregnant. Summary Trichomoniasis is a sexually transmitted infection (STI). This condition often has no symptoms or minimal   symptoms. Take your antimicrobial medicine as told by your health care provider. Do not stop taking even if you start to feel better. Discuss your  infection with your sexual partner or partners. Make sure that all partners get tested and treated, if necessary. You should not have sex until after you finish your medicine and your symptoms have resolved. Keep all follow-up visits. This is important. This information is not intended to replace advice given to you by your health care provider. Make sure you discuss any questions you have with your health care provider. Document Revised: 07/27/2021 Document Reviewed: 07/27/2021 Elsevier Patient Education  2024 [employer]  

## 2023-05-16 NOTE — Telephone Encounter (Signed)
Order received for a CPAP machine. I tried to reach the patient to inquire if he has a preference for DME company.  His voicemail was full, unable to lave a message.    MyChart message then sent to him inquiring about DME company preference

## 2023-05-16 NOTE — Telephone Encounter (Signed)
Can you please assist him in obtaining a CPAP machine?  Sleep study was in 2021.  Thank you

## 2023-05-17 ENCOUNTER — Other Ambulatory Visit: Payer: Self-pay

## 2023-05-17 ENCOUNTER — Other Ambulatory Visit: Payer: Self-pay | Admitting: Family Medicine

## 2023-05-17 DIAGNOSIS — E034 Atrophy of thyroid (acquired): Secondary | ICD-10-CM

## 2023-05-17 LAB — CMP14+EGFR
ALT: 24 IU/L (ref 0–44)
AST: 19 IU/L (ref 0–40)
Albumin: 4.5 g/dL (ref 4.1–5.1)
Alkaline Phosphatase: 77 IU/L (ref 44–121)
BUN/Creatinine Ratio: 13 (ref 9–20)
BUN: 11 mg/dL (ref 6–20)
Bilirubin Total: 0.3 mg/dL (ref 0.0–1.2)
CO2: 22 mmol/L (ref 20–29)
Calcium: 9.7 mg/dL (ref 8.7–10.2)
Chloride: 99 mmol/L (ref 96–106)
Creatinine, Ser: 0.86 mg/dL (ref 0.76–1.27)
Globulin, Total: 2.8 g/dL (ref 1.5–4.5)
Glucose: 100 mg/dL — ABNORMAL HIGH (ref 70–99)
Potassium: 4.2 mmol/L (ref 3.5–5.2)
Sodium: 138 mmol/L (ref 134–144)
Total Protein: 7.3 g/dL (ref 6.0–8.5)
eGFR: 116 mL/min/{1.73_m2} (ref >59)

## 2023-05-17 LAB — TSH: TSH: 7.62 u[IU]/mL — ABNORMAL HIGH (ref 0.450–4.500)

## 2023-05-17 LAB — T4, FREE: Free T4: 0.8 ng/dL — ABNORMAL LOW (ref 0.82–1.77)

## 2023-05-17 LAB — T3: T3, Total: 96 ng/dL (ref 71–180)

## 2023-05-17 MED ORDER — LEVOTHYROXINE SODIUM 112 MCG PO TABS
112.0000 ug | ORAL_TABLET | Freq: Every day | ORAL | 3 refills | Status: DC
Start: 2023-05-17 — End: 2023-08-16
  Filled 2023-05-17: qty 30, 30d supply, fill #0

## 2023-05-17 NOTE — Telephone Encounter (Signed)
This was addressed in another telephone encounter yesterday

## 2023-05-17 NOTE — Telephone Encounter (Signed)
I tried to reach the patient again and his voicemail is full

## 2023-05-18 ENCOUNTER — Telehealth: Payer: Self-pay | Admitting: Family Medicine

## 2023-05-18 LAB — URINE CYTOLOGY ANCILLARY ONLY
Chlamydia: NEGATIVE
Comment: NEGATIVE
Comment: NEGATIVE
Comment: NORMAL
Neisseria Gonorrhea: NEGATIVE
Trichomonas: NEGATIVE

## 2023-05-18 NOTE — Telephone Encounter (Signed)
Copied from CRM [id number]. Topic: General - Other >> May 17, 2023  3:01 PM Darren Barajas wrote: Reason for CRM: Pt stated that he was returning a missed call he received from the office. Cb# [phone]

## 2023-05-22 NOTE — Telephone Encounter (Signed)
Call returned to the patient and explained to him that I wanted to inquire if he has a preferred DME company for his CPAP machine. I explained that Resmed is the brand of CPAP but local DME companies distribute resmed machines. He said he has no preference but would like a company in [place]. I told him there are multiple companies and I will send the order to [facility].   They will verify his insurance coverage and then contact him. He was in agreement.

## 2023-05-23 ENCOUNTER — Other Ambulatory Visit: Payer: Self-pay

## 2023-05-23 NOTE — Telephone Encounter (Signed)
Order for CPAP faxed to [facility]

## 2023-05-24 ENCOUNTER — Encounter (HOSPITAL_COMMUNITY): Payer: Self-pay

## 2023-05-24 ENCOUNTER — Ambulatory Visit (HOSPITAL_COMMUNITY)
Admission: EM | Admit: 2023-05-24 | Discharge: 2023-05-24 | Disposition: A | Discharge status: Home / Self Care | Payer: 59 | Attending: Family Medicine | Admitting: Family Medicine

## 2023-05-24 DIAGNOSIS — S39012A Strain of muscle, fascia and tendon of lower back, initial encounter: Secondary | ICD-10-CM

## 2023-05-24 MED ORDER — KETOROLAC TROMETHAMINE 60 MG/2ML IM SOLN
INTRAMUSCULAR | Status: AC
  Filled 2023-05-24: qty 2

## 2023-05-24 MED ORDER — HYDROCODONE-ACETAMINOPHEN 5-325 MG PO TABS
ORAL_TABLET | ORAL | Status: AC
  Filled 2023-05-24: qty 1

## 2023-05-24 MED ORDER — HYDROCODONE-ACETAMINOPHEN 5-325 MG PO TABS
1.0000 | ORAL_TABLET | Freq: Once | ORAL | Status: AC
  Administered 2023-05-24: 1 via ORAL

## 2023-05-24 MED ORDER — CYCLOBENZAPRINE HCL 10 MG PO TABS: ORAL_TABLET | ORAL | 0 refills | Status: AC

## 2023-05-24 MED ORDER — KETOROLAC TROMETHAMINE 60 MG/2ML IM SOLN
60.0000 mg | Freq: Once | INTRAMUSCULAR | Status: AC
  Administered 2023-05-24: 60 mg via INTRAMUSCULAR

## 2023-05-24 MED ORDER — DICLOFENAC SODIUM 75 MG PO TBEC: 75.0000 mg | DELAYED_RELEASE_TABLET | Freq: Two times a day (BID) | ORAL | 0 refills | Status: AC

## 2023-05-24 NOTE — Discharge Instructions (Signed)
Meds ordered this encounter  Medications   ketorolac (TORADOL) injection 60 mg   HYDROcodone-acetaminophen (NORCO/VICODIN) 5-325 MG per tablet 1 tablet   cyclobenzaprine (FLEXERIL) 10 MG tablet    Sig: Take 1 tablet by mouth 3 times daily as needed for muscle spasm. Warning: May cause drowsiness.    Dispense:  21 tablet    Refill:  0   diclofenac (VOLTAREN) 75 MG EC tablet    Sig: Take 1 tablet (75 mg total) by mouth 2 (two) times daily.    Dispense:  14 tablet    Refill:  0   HOME CARE INSTRUCTIONS: For many people, back pain returns. Since low back pain is rarely dangerous, it is often a condition that people can learn to manage on their own. Please remain active. It is stressful on the back to sit or stand in one place. Do not sit, drive, or stand in one place for more than 30 minutes at a time. Take short walks on level surfaces as soon as pain allows. Try to increase the length of time you walk each day. Do not stay in bed. Resting more than 1 or 2 days can delay your recovery. Do not avoid exercise or work. Your body is made to move. It is not dangerous to be active, even though your back may hurt. Your back will likely heal faster if you return to being active before your pain is gone. Over-the-counter medicines to reduce pain and inflammation are often the most helpful.  SEEK MEDICAL CARE IF: You have pain that is not relieved with rest or medicine. You have pain that does not improve in 1 week. You have new symptoms. You are generally not feeling well.  SEEK IMMEDIATE MEDICAL CARE IF: You have pain that radiates from your back into your legs. You develop new bowel or bladder control problems. You have unusual weakness or numbness in your arms or legs. You develop nausea or vomiting. You develop abdominal pain. You feel faint.
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
## 2023-05-28 NOTE — Telephone Encounter (Signed)
I spoke to [employer] and she said the order for the CPAP cannot be accepted as ordered and they will need to have the provider re-write.  She is going to send me what they need signed.
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
## 2023-05-30 NOTE — Telephone Encounter (Signed)
Standard order signed by Dr Alvis Lemmings and faxed to [facility].

## 2023-05-31 NOTE — Telephone Encounter (Signed)
I spoke to Terry/ [facility] and she confirmed that they received the standard order for the CPAP and it will be ready for processing.

## 2023-06-05 NOTE — Telephone Encounter (Signed)
I spoke to [employer] and she stated that they are still processing the CPAP order.

## 2023-06-13 ENCOUNTER — Telehealth: Payer: Self-pay

## 2023-06-13 NOTE — Telephone Encounter (Signed)
Pt has been informed that paperwork has been received and he will be called once ready for pick up.

## 2023-06-13 NOTE — Telephone Encounter (Signed)
Copied from CRM [id number]. Topic: General - Other >> Jun 13, 2023 10:11 AM Epimenio Foot F wrote: Reason for CRM: Pt is calling in checking on the status of paperwork he dropped off at the office for his PCP to fill out and sign. Please follow up with pt when the paperwork has been completed.

## 2023-06-14 ENCOUNTER — Telehealth: Payer: Self-pay | Admitting: Family Medicine

## 2023-06-14 NOTE — Telephone Encounter (Signed)
Copied from CRM [id number]. Topic: General - Other >> Jun 14, 2023 12:09 PM Turkey B wrote: Reason for CRM: pt called in says he will bring in form to fill out so he can return to work on Monday and that the previous note can be disregarded.

## 2023-06-15 NOTE — Telephone Encounter (Signed)
Pt calling today and states he really needs the paperwork filled out ASAP

## 2023-06-18 ENCOUNTER — Telehealth: Payer: Self-pay

## 2023-06-18 NOTE — Telephone Encounter (Signed)
Done

## 2023-06-18 NOTE — Telephone Encounter (Signed)
Patient states that he needs a letter saying that he is ok to go back to work tomorrow.  I informed him that he could print the letter from his mychart

## 2023-06-18 NOTE — Telephone Encounter (Signed)
Pt has been contacted and scheduled an appointment.

## 2023-06-19 ENCOUNTER — Encounter: Payer: Self-pay | Admitting: Family Medicine

## 2023-06-19 ENCOUNTER — Telehealth: Payer: 59 | Admitting: Family Medicine

## 2023-06-19 DIAGNOSIS — Z634 Disappearance and death of family member: Secondary | ICD-10-CM | POA: Diagnosis not present

## 2023-06-19 DIAGNOSIS — G4733 Obstructive sleep apnea (adult) (pediatric): Secondary | ICD-10-CM | POA: Diagnosis not present

## 2023-06-19 NOTE — Progress Notes (Signed)
Virtual Visit via Telephone Note  I connected with Darren Barajas, on 06/19/2023 at 3:27 PM by telephone and verified that I am speaking with the correct person using two identifiers.   Consent: I discussed the limitations, risks, security and privacy concerns of performing an evaluation and management service by telephone and the availability of in person appointments. I also discussed with the patient that there may be a patient responsible charge related to this service. The patient expressed understanding and agreed to proceed.   Location of Patient: Home  Location of Provider: Clinic   Persons participating in Telemedicine visit: Darren Barajas Dr. Alvis Lemmings     History of Present Illness: Darren Barajas is a 37 y.o. year old male with a history of migraines, obstructive sleep apnea (on CPAP), hypertension, hypothyroidism, hyperlipidemia.  He has been out of work due to daytime somnolence from untreated sleep apnea.  He was waiting on obtaining CPAP supplies versus an oral device.  He had also complained of bereavement and had been self-medicating with alcohol. Nuvigil was prescribed and he is finding he is more awake and alert. He requires a letter of fitness to return to work.   Past Medical History:  Diagnosis Date   Anginal pain (HCC)    Chronic bronchitis (HCC)    "get it q couple months; q couple weeks" (04/07/2015)   Graves' disease    treated w/radioactive ioding ablation in 2001   Headache    "weekly" (04/07/2015)   Hypertension    Not currently taking medications   No Known Allergies  Current Outpatient Medications on File Prior to Visit  Medication Sig Dispense Refill   Armodafinil 50 MG tablet Take 1 tablet (50 mg total) by mouth daily. 30 tablet 1   atorvastatin (LIPITOR) 20 MG tablet Take 1 tablet (20 mg total) by mouth daily. 90 tablet 1   cyclobenzaprine (FLEXERIL) 10 MG tablet Take 1 tablet by mouth 3 times daily as needed for muscle  spasm. Warning: May cause drowsiness. 21 tablet 0   diclofenac (VOLTAREN) 75 MG EC tablet Take 1 tablet (75 mg total) by mouth 2 (two) times daily. 14 tablet 0   fluticasone (FLONASE) 50 MCG/ACT nasal spray Place 2 sprays into both nostrils daily. 16 g 6   ibuprofen (ADVIL) 800 MG tablet Take 1 tablet (800 mg total) by mouth every 12 (twelve) hours as needed. 60 tablet 0   levothyroxine (SYNTHROID) 112 MCG tablet Take 1 tablet (112 mcg total) by mouth daily before breakfast. 30 tablet 3   Misc. Devices MISC CPAP on 19 cm of water.  Diagnosis- Obstructive sleep apnea. Medium Wide size Philips Respironics Full Face  Mask Dreamwear mask and heated humidification. 1 each 0   Multiple Vitamins-Minerals (MULTIVITAMIN ADULT PO) Take 1 tablet by mouth daily.     olmesartan-hydrochlorothiazide (BENICAR HCT) 40-25 MG tablet Take 1 tablet by mouth daily. 90 tablet 1   omeprazole (PRILOSEC) 20 MG capsule Take 1 capsule (20 mg total) by mouth daily. 30 capsule 0   spironolactone (ALDACTONE) 25 MG tablet Take 1 tablet (25 mg total) by mouth daily. 90 tablet 1   No current facility-administered medications on file prior to visit.    ROS: See HPI  Observations/Objective: Awake, alert, oriented x3 Not in acute distress Normal mood      Latest Ref Rng & Units 05/16/2023    3:57 PM 05/17/2021    8:34 AM 03/22/2020   10:20 AM  CMP  Glucose 70 -  99 mg/dL 782  956  213   BUN 6 - 20 mg/dL 11  10  14    Creatinine 0.76 - 1.27 mg/dL 0.86  5.78  4.69   Sodium 134 - 144 mmol/L 138  137  137   Potassium 3.5 - 5.2 mmol/L 4.2  4.1  3.8   Chloride 96 - 106 mmol/L 99  97  97   CO2 20 - 29 mmol/L 22  26  23    Calcium 8.7 - 10.2 mg/dL 9.7  9.8  9.5   Total Protein 6.0 - 8.5 g/dL 7.3  7.5  7.6   Total Bilirubin 0.0 - 1.2 mg/dL 0.3  0.8  0.6   Alkaline Phos 44 - 121 IU/L 77  72  67   AST 0 - 40 IU/L 19  22  19    ALT 0 - 44 IU/L 24  24  28      Lipid Panel     Component Value Date/Time   CHOL 225 (H) 05/17/2021  0834   TRIG 148 05/17/2021 0834   HDL 60 05/17/2021 0834   CHOLHDL 3.8 05/17/2021 0834   LDLCALC 139 (H) 05/17/2021 0834   LABVLDL 26 05/17/2021 0834    No results found for: "HGBA1C"  Assessment and Plan: 1. OSA (obstructive sleep apnea) He has obtained his CPAP supplies and is doing well He has no daytime somnolence and remains on Nuvigil as needed  2. Bereavement Stable He feels he is fit to return to work Form has been completed   Follow Up Instructions: Keep previously scheduled appointment   I discussed the assessment and treatment plan with the patient. The patient was provided an opportunity to ask questions and all were answered. The patient agreed with the plan and demonstrated an understanding of the instructions.   The patient was advised to call back or seek an in-person evaluation if the symptoms worsen or if the condition fails to improve as anticipated.     I provided 12 minutes total of non-face-to-face time during this encounter.   Hoy Register, MD, FAAFP. [facility] [place], [place] [phone]   06/19/2023, 3:27 PM

## 2023-06-26 NOTE — Telephone Encounter (Signed)
I spoke to Darren Barajas and she said the patient was set up with his CPAP on 06/12/2023.

## 2023-07-16 ENCOUNTER — Other Ambulatory Visit: Payer: Self-pay | Admitting: Family Medicine

## 2023-08-15 ENCOUNTER — Ambulatory Visit: Payer: 59 | Attending: Family Medicine | Admitting: Family Medicine

## 2023-08-15 ENCOUNTER — Encounter: Payer: Self-pay | Admitting: Family Medicine

## 2023-08-15 VITALS — BP 148/92 | HR 79 | Ht 72.0 in | Wt 252.6 lb

## 2023-08-15 DIAGNOSIS — R400 Somnolence: Secondary | ICD-10-CM

## 2023-08-15 DIAGNOSIS — E039 Hypothyroidism, unspecified: Secondary | ICD-10-CM

## 2023-08-15 DIAGNOSIS — R109 Unspecified abdominal pain: Secondary | ICD-10-CM

## 2023-08-15 DIAGNOSIS — G4733 Obstructive sleep apnea (adult) (pediatric): Secondary | ICD-10-CM | POA: Diagnosis not present

## 2023-08-15 DIAGNOSIS — E7800 Pure hypercholesterolemia, unspecified: Secondary | ICD-10-CM | POA: Diagnosis not present

## 2023-08-15 DIAGNOSIS — Z131 Encounter for screening for diabetes mellitus: Secondary | ICD-10-CM

## 2023-08-15 MED ORDER — ATORVASTATIN CALCIUM 20 MG PO TABS: 20.0000 mg | ORAL_TABLET | Freq: Every day | ORAL | 1 refills | Status: DC

## 2023-08-15 MED ORDER — OLMESARTAN MEDOXOMIL-HCTZ 40-25 MG PO TABS: 1.0000 | ORAL_TABLET | Freq: Every day | ORAL | 1 refills | Status: DC

## 2023-08-15 NOTE — Patient Instructions (Signed)

## 2023-08-15 NOTE — Progress Notes (Signed)
Subjective:  Patient ID: Darren Barajas, male    DOB: 06/01/1986  Age: 37 y.o. MRN: [id number]  CC: Medical Management of Chronic Issues   HPI Darren Barajas is a 37 y.o. year old male with a history of migraines, obstructive sleep apnea (on CPAP), hypertension, hypothyroidism, hyperlipidemia.   Interval History: Discussed the use of AI scribe software for clinical note transcription with the patient, who gave verbal consent to proceed.  He presents for a follow-up visit. He reports difficulty adhering to his CPAP machine use, preferring to use a nasal mask instead of a full face mask. He also mentions forgetting to use the machine before going to bed. Despite this, he reports an improvement in his sleep quality and daytime alertness, attributing this to his medication Nuvigil as he previously had daytime somnolence.  He also ordered an oral device for his sleep apnea and is awaiting this to be delivered as he had to pay out-of-pocket.  The patient also mentions experiencing a squeezing sensation in his left upper abdomen which he initially thought was heartburn. He reports this sensation is more pronounced when he is not using his CPAP machine and when he sleeps on his back.  In addition to his sleep apnea, the patient is also managing hypertension and high cholesterol. He reports forgetting to take his blood pressure medication today and also expresses a desire to manage his conditions through natural means in the future. He also mentions concerns about potential sexual side effects from blood pressure medications and would like to avoid any medication that could cause that.  He endorses adherence with his levothyroxine.  Last TSH was elevated.    Past Medical History:  Diagnosis Date   Anginal pain (HCC)    Chronic bronchitis (HCC)    "get it q couple months; q couple weeks" (04/07/2015)   Graves' disease    treated w/radioactive ioding ablation in 2001   Headache    "weekly"  (04/07/2015)   Hypertension    Not currently taking medications    Past Surgical History:  Procedure Laterality Date   NO PAST SURGERIES      Family History  Problem Relation Age of Onset   Hyperthyroidism Mother    Hyperthyroidism Maternal Uncle     Social History   Socioeconomic History   Marital status: Single    Spouse name: Not on file   Number of children: Not on file   Years of education: Not on file   Highest education level: Not on file  Occupational History   Not on file  Tobacco Use   Smoking status: Some Days    Current packs/day: 1.00    Average packs/day: 1 pack/day for 10.0 years (10.0 ttl pk-yrs)    Types: Cigarettes   Smokeless tobacco: Never  Vaping Use   Vaping status: Never Used  Substance and Sexual Activity   Alcohol use: Yes    Alcohol/week: 36.0 standard drinks of alcohol    Types: 36 Cans of beer per week    Comment: 04/07/2015 "12 pack beer 2-3 days/wk"   Drug use: Not Currently    Types: MDMA (Ecstacy), Cocaine, Marijuana    Comment: 04/07/2015 "last used MDMA, Xanax 04/06/2015; haven't used coke in the last couple months"   Sexual activity: Yes  Other Topics Concern   Not on file  Social History Narrative   Works as Lawyer   Social Determinants of Health   Financial Resource Strain: Low Risk  (08/15/2023)  Overall Financial Resource Strain (CARDIA)    Difficulty of Paying Living Expenses: Not very hard  Food Insecurity: No Food Insecurity (08/15/2023)   Hunger Vital Sign    Worried About Running Out of Food in the Last Year: Never true    Ran Out of Food in the Last Year: Never true  Transportation Needs: No Transportation Needs (08/15/2023)   PRAPARE - Administrator, Civil Service (Medical): No    Lack of Transportation (Non-Medical): No  Physical Activity: Patient Declined (08/15/2023)   Exercise Vital Sign    Days of Exercise per Week: Patient declined    Minutes of Exercise per Session: Patient declined  Stress: No  Stress Concern Present (08/15/2023)   [employer] of Occupational Health - Occupational Stress Questionnaire    Feeling of Stress : Not at all  Social Connections: Unknown (08/15/2023)   Social Connection and Isolation Panel [NHANES]    Frequency of Communication with Friends and Family: More than three times a week    Frequency of Social Gatherings with Friends and Family: More than three times a week    Attends Religious Services: 1 to 4 times per year    Active Member of [employer] or Organizations: No    Attends Engineer, structural: 1 to 4 times per year    Marital Status: Not on file    No Known Allergies  Outpatient Medications Prior to Visit  Medication Sig Dispense Refill   levothyroxine (SYNTHROID) 112 MCG tablet Take 1 tablet (112 mcg total) by mouth daily before breakfast. 30 tablet 3   olmesartan-hydrochlorothiazide (BENICAR HCT) 40-25 MG tablet Take 1 tablet by mouth daily. 90 tablet 1   Armodafinil 50 MG tablet TAKE 1 TABLET BY MOUTH EVERY DAY (Patient not taking: Reported on 08/15/2023) 30 tablet 1   cyclobenzaprine (FLEXERIL) 10 MG tablet Take 1 tablet by mouth 3 times daily as needed for muscle spasm. Warning: May cause drowsiness. (Patient not taking: Reported on 08/15/2023) 21 tablet 0   diclofenac (VOLTAREN) 75 MG EC tablet Take 1 tablet (75 mg total) by mouth 2 (two) times daily. (Patient not taking: Reported on 08/15/2023) 14 tablet 0   fluticasone (FLONASE) 50 MCG/ACT nasal spray Place 2 sprays into both nostrils daily. (Patient not taking: Reported on 08/15/2023) 16 g 6   ibuprofen (ADVIL) 800 MG tablet Take 1 tablet (800 mg total) by mouth every 12 (twelve) hours as needed. (Patient not taking: Reported on 08/15/2023) 60 tablet 0   Misc. Devices MISC CPAP on 19 cm of water.  Diagnosis- Obstructive sleep apnea. Medium Wide size Philips Respironics Full Face  Mask Dreamwear mask and heated humidification. (Patient not taking: Reported on 08/15/2023) 1 each 0    Multiple Vitamins-Minerals (MULTIVITAMIN ADULT PO) Take 1 tablet by mouth daily. (Patient not taking: Reported on 08/15/2023)     omeprazole (PRILOSEC) 20 MG capsule Take 1 capsule (20 mg total) by mouth daily. (Patient not taking: Reported on 08/15/2023) 30 capsule 0   atorvastatin (LIPITOR) 20 MG tablet Take 1 tablet (20 mg total) by mouth daily. (Patient not taking: Reported on 08/15/2023) 90 tablet 1   spironolactone (ALDACTONE) 25 MG tablet Take 1 tablet (25 mg total) by mouth daily. (Patient not taking: Reported on 08/15/2023) 90 tablet 1   No facility-administered medications prior to visit.     ROS Review of Systems  Constitutional:  Negative for activity change and appetite change.  HENT:  Negative for sinus pressure and sore throat.  Respiratory:  Negative for chest tightness, shortness of breath and wheezing.   Cardiovascular:  Negative for chest pain and palpitations.  Gastrointestinal:  Negative for abdominal distention, abdominal pain and constipation.  Genitourinary: Negative.   Musculoskeletal: Negative.   Psychiatric/Behavioral:  Negative for behavioral problems and dysphoric mood.     Objective:  BP (!) 148/92   Pulse 79   Ht 6' (1.829 m)   Wt 252 lb 9.6 oz (114.6 kg)   SpO2 96%   BMI 34.26 kg/m      08/15/2023    2:11 PM 08/15/2023    1:39 PM 05/24/2023    7:21 PM  BP/Weight  Systolic BP 148 150 163  Diastolic BP 92 91 100  Wt. (Lbs)  252.6 250  BMI  34.26 kg/m2 33.91 kg/m2      Physical Exam Constitutional:      Appearance: He is well-developed.  Cardiovascular:     Rate and Rhythm: Normal rate.     Heart sounds: Normal heart sounds. No murmur heard. Pulmonary:     Effort: Pulmonary effort is normal.     Breath sounds: Normal breath sounds. No wheezing or rales.  Chest:     Chest wall: No tenderness.  Abdominal:     General: Bowel sounds are normal. There is no distension.     Palpations: Abdomen is soft. There is no mass.     Tenderness: There  is no abdominal tenderness.  Musculoskeletal:        General: Normal range of motion.     Right lower leg: No edema.     Left lower leg: No edema.  Neurological:     Mental Status: He is alert and oriented to person, place, and time.  Psychiatric:        Mood and Affect: Mood normal.        Latest Ref Rng & Units 05/16/2023    3:57 PM 05/17/2021    8:34 AM 03/22/2020   10:20 AM  CMP  Glucose 70 - 99 mg/dL 161  096  045   BUN 6 - 20 mg/dL 11  10  14    Creatinine 0.76 - 1.27 mg/dL 4.09  8.11  9.14   Sodium 134 - 144 mmol/L 138  137  137   Potassium 3.5 - 5.2 mmol/L 4.2  4.1  3.8   Chloride 96 - 106 mmol/L 99  97  97   CO2 20 - 29 mmol/L 22  26  23    Calcium 8.7 - 10.2 mg/dL 9.7  9.8  9.5   Total Protein 6.0 - 8.5 g/dL 7.3  7.5  7.6   Total Bilirubin 0.0 - 1.2 mg/dL 0.3  0.8  0.6   Alkaline Phos 44 - 121 IU/L 77  72  67   AST 0 - 40 IU/L 19  22  19    ALT 0 - 44 IU/L 24  24  28      Lipid Panel     Component Value Date/Time   CHOL 225 (H) 05/17/2021 0834   TRIG 148 05/17/2021 0834   HDL 60 05/17/2021 0834   CHOLHDL 3.8 05/17/2021 0834   LDLCALC 139 (H) 05/17/2021 0834    CBC    Component Value Date/Time   WBC 5.0 02/24/2019 1215   RBC 4.89 02/24/2019 1215   HGB 14.9 02/24/2019 1215   HCT 44.1 02/24/2019 1215   PLT 256 02/24/2019 1215   MCV 90.2 02/24/2019 1215   MCH 30.5 02/24/2019 1215  MCHC 33.8 02/24/2019 1215   RDW 12.8 02/24/2019 1215   LYMPHSABS 2.1 02/24/2019 1215   MONOABS 0.8 02/24/2019 1215   EOSABS 0.1 02/24/2019 1215   BASOSABS 0.0 02/24/2019 1215    No results found for: "HGBA1C"  Lab Results  Component Value Date   TSH 7.620 (H) 05/16/2023    Assessment & Plan:      Obstructive Sleep Apnea Non-compliant with CPAP therapy due to discomfort with mask and forgetfulness. Reports improved daytime sleepiness with use of Nuvigil as needed. -Encourage consistent use of CPAP therapy. -Consider nasal CPAP mask as per patient's  preference.  Daytime somnolence -Continue Nuvigil as needed for daytime sleepiness. -Advised that treated sleep apnea will also improve his alertness  Hypertension Elevated blood pressure at visit due to missed dose of Benicar. Patient expresses desire for natural remedies but understands the need for medication at this time. -Continue Benicar. -Encourage consistent medication use and lifestyle modifications. -Recheck blood pressure at next visit. -Counseled on blood pressure goal of less than 130/80, low-sodium, DASH diet, medication compliance, 150 minutes of moderate intensity exercise per week. Discussed medication compliance, adverse effects.   Hyperlipidemia Non-compliant with Atorvastatin. Last cholesterol check in 2022. -Order fasting lipid panel. -Encourage patient to restart Atorvastatin pending lipid panel results.  Hypothyroidism Taking Levothyroxine inconsistently. Last abnormal thyroid function test. -Order thyroid function test. -Encourage consistent use of Levothyroxine.  Abdominal Pain Reports intermittent squeezing pain in the  LUQ relieved by antacid. -Continue PPI for treatment of gastroesophageal reflux disease (GERD). -Encourage lifestyle modifications for GERD (e.g., avoid trigger foods, elevate head of bed).          Meds ordered this encounter  Medications   atorvastatin (LIPITOR) 20 MG tablet    Sig: Take 1 tablet (20 mg total) by mouth daily.    Dispense:  90 tablet    Refill:  1   olmesartan-hydrochlorothiazide (BENICAR HCT) 40-25 MG tablet    Sig: Take 1 tablet by mouth daily.    Dispense:  90 tablet    Refill:  1    Follow-up: Return in about 3 months (around 11/13/2023) for Chronic medical conditions.       Hoy Register, MD, FAAFP. [facility] [place], [place] [phone]   08/15/2023, 3:11 PM

## 2023-08-16 ENCOUNTER — Other Ambulatory Visit: Payer: Self-pay | Admitting: Family Medicine

## 2023-08-16 DIAGNOSIS — E034 Atrophy of thyroid (acquired): Secondary | ICD-10-CM

## 2023-08-16 LAB — LP+NON-HDL CHOLESTEROL
Cholesterol, Total: 245 mg/dL — ABNORMAL HIGH (ref 100–199)
HDL: 75 mg/dL (ref >39)
LDL Chol Calc (NIH): 150 mg/dL — ABNORMAL HIGH (ref 0–99)
Total Non-HDL-Chol (LDL+VLDL): 170 mg/dL — ABNORMAL HIGH (ref 0–129)
Triglycerides: 117 mg/dL (ref 0–149)
VLDL Cholesterol Cal: 20 mg/dL (ref 5–40)

## 2023-08-16 LAB — T3: T3, Total: 103 ng/dL (ref 71–180)

## 2023-08-16 LAB — CMP14+EGFR
ALT: 23 [IU]/L (ref 0–44)
AST: 30 [IU]/L (ref 0–40)
Albumin: 4.7 g/dL (ref 4.1–5.1)
Alkaline Phosphatase: 64 [IU]/L (ref 44–121)
BUN/Creatinine Ratio: 11 (ref 9–20)
BUN: 10 mg/dL (ref 6–20)
Bilirubin Total: 0.8 mg/dL (ref 0.0–1.2)
CO2: 25 mmol/L (ref 20–29)
Calcium: 9.7 mg/dL (ref 8.7–10.2)
Chloride: 98 mmol/L (ref 96–106)
Creatinine, Ser: 0.94 mg/dL (ref 0.76–1.27)
Globulin, Total: 2.9 g/dL (ref 1.5–4.5)
Glucose: 98 mg/dL (ref 70–99)
Potassium: 4.4 mmol/L (ref 3.5–5.2)
Sodium: 139 mmol/L (ref 134–144)
Total Protein: 7.6 g/dL (ref 6.0–8.5)
eGFR: 108 mL/min/{1.73_m2} (ref >59)

## 2023-08-16 LAB — T4, FREE: Free T4: 1.02 ng/dL (ref 0.82–1.77)

## 2023-08-16 LAB — HEMOGLOBIN A1C
Est. average glucose Bld gHb Est-mCnc: 137 mg/dL
Hgb A1c MFr Bld: 6.4 % — ABNORMAL HIGH (ref 4.8–5.6)

## 2023-08-16 LAB — TSH: TSH: 7.87 u[IU]/mL — ABNORMAL HIGH (ref 0.450–4.500)

## 2023-08-16 MED ORDER — LEVOTHYROXINE SODIUM 125 MCG PO TABS: 125.0000 ug | ORAL_TABLET | Freq: Every day | ORAL | 1 refills | Status: DC

## 2023-10-07 ENCOUNTER — Other Ambulatory Visit: Payer: Self-pay | Admitting: Family Medicine

## 2023-10-08 NOTE — Telephone Encounter (Signed)
Requested medication (s) are due for refill today: yes   Requested medication (s) are on the active medication list: yes   Last refill:  07/17/23 #30 1 refills   Future visit scheduled: yes in 1 month  Notes to clinic:  not delegated per protocol. Do you want to refill Rx?     Requested Prescriptions  Pending Prescriptions Disp Refills   Armodafinil 50 MG tablet [Pharmacy Med Name: ARMODAFINIL 50 MG TABLET] 30 tablet 1    Sig: TAKE 1 TABLET BY MOUTH EVERY DAY     Not Delegated - Psychiatry:  Stimulants/ADHD Failed - 10/08/2023  3:27 PM      Failed - This refill cannot be delegated      Failed - Urine Drug Screen completed in last 360 days      Failed - Last BP in normal range    BP Readings from Last 1 Encounters:  08/15/23 (!) 148/92         Passed - Last Heart Rate in normal range    Pulse Readings from Last 1 Encounters:  08/15/23 79         Passed - Valid encounter within last 6 months    Recent Outpatient Visits           1 month ago OSA (obstructive sleep apnea)   [place] [facility] Of [place]. [facility] Hoy Register, MD   3 months ago OSA (obstructive sleep apnea)   [facility] - [facility] Dept Of [place]. [facility] Hoy Register, MD   4 months ago OSA (obstructive sleep apnea)   [place] [facility] - A Dept Of [place]. [facility] Hoy Register, MD   2 years ago Hypothyroidism due to acquired atrophy of thyroid   [place] [facility] - [facility] Dept Of [place]. [facility] Hoy Register, MD   3 years ago Tobacco use    [facility] [place] - A Dept Of [place]. [facility] Hoy Register, MD       Future Appointments             In 1 month Hoy Register, MD [facility] [place] - A Dept Of [place]. [facility]

## 2023-11-13 ENCOUNTER — Ambulatory Visit: Payer: 59 | Admitting: Family Medicine

## 2023-12-21 ENCOUNTER — Telehealth: Payer: Self-pay | Admitting: Family Medicine

## 2023-12-21 NOTE — Telephone Encounter (Signed)
Patient was called and informed of letter being ready for pick up. 

## 2023-12-21 NOTE — Telephone Encounter (Signed)
 Routing to PCP for review.

## 2023-12-21 NOTE — Telephone Encounter (Signed)
 Letter ready.

## 2023-12-21 NOTE — Telephone Encounter (Signed)
 Copied from CRM [id number]. 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

## 2023-12-31 ENCOUNTER — Other Ambulatory Visit: Payer: Self-pay | Admitting: Family Medicine

## 2023-12-31 NOTE — Telephone Encounter (Signed)
 Requested medication (s) are due for refill today:   Provider to review  Requested medication (s) are on the active medication list:   Yes  Future visit scheduled:   No.   LOV was a No Show on 11/13/2023.   LOV 08/15/2023   Last ordered: 10/10/2023 #30, 1 refill  Non delegated refill    Requested Prescriptions  Pending Prescriptions Disp Refills   Armodafinil  50 MG tablet [Pharmacy Med Name: ARMODAFINIL  50 MG TABLET] 30 tablet 1    Sig: TAKE 1 TABLET BY MOUTH EVERY DAY     Not Delegated - Psychiatry:  Stimulants/ADHD Failed - 12/31/2023  3:52 PM      Failed - This refill cannot be delegated      Failed - Urine Drug Screen completed in last 360 days      Failed - Last BP in normal range    BP Readings from Last 1 Encounters:  08/15/23 (!) 148/92         Passed - Last Heart Rate in normal range    Pulse Readings from Last 1 Encounters:  08/15/23 79         Passed - Valid encounter within last 6 months    Recent Outpatient Visits           4 months ago OSA (obstructive sleep apnea)   [place] [facility] Of [place]. [facility] Joaquin Mulberry, MD   6 months ago OSA (obstructive sleep apnea)   [place] [facility] - A Dept Of [place]. [facility] Joaquin Mulberry, MD   7 months ago OSA (obstructive sleep apnea)   [place] [facility] - A Dept Of [place]. [facility] Joaquin Mulberry, MD   2 years ago Hypothyroidism due to acquired atrophy of thyroid    [place] [facility] - A Dept Of [place]. [facility] Joaquin Mulberry, MD   3 years ago Tobacco use   [place] [facility] - A Dept Of [place]. [facility] Joaquin Mulberry, MD

## 2024-01-23 ENCOUNTER — Telehealth: Payer: Self-pay

## 2024-01-23 ENCOUNTER — Other Ambulatory Visit: Payer: Self-pay

## 2024-01-23 NOTE — Telephone Encounter (Signed)
 Pharmacy Patient Advocate Encounter  Received notification from [facility] that Prior Authorization for Armodafinil  has been APPROVED from 01/22/2024 to 01/21/2025   PA #/Case ID/Reference #: [id number]

## 2024-03-28 ENCOUNTER — Telehealth: Payer: Self-pay | Admitting: Family Medicine

## 2024-03-28 NOTE — Telephone Encounter (Signed)
 Contacted pt lvm to confirmed appt

## 2024-03-31 ENCOUNTER — Ambulatory Visit: Admitting: Family Medicine

## 2024-04-19 ENCOUNTER — Other Ambulatory Visit: Payer: Self-pay | Admitting: Family Medicine

## 2024-04-19 DIAGNOSIS — E034 Atrophy of thyroid (acquired): Secondary | ICD-10-CM

## 2024-05-13 ENCOUNTER — Other Ambulatory Visit: Payer: Self-pay | Admitting: Family Medicine

## 2024-05-13 DIAGNOSIS — E034 Atrophy of thyroid (acquired): Secondary | ICD-10-CM

## 2024-07-01 ENCOUNTER — Other Ambulatory Visit: Payer: Self-pay | Admitting: Family Medicine

## 2024-07-01 DIAGNOSIS — E034 Atrophy of thyroid (acquired): Secondary | ICD-10-CM

## 2024-07-08 ENCOUNTER — Other Ambulatory Visit: Payer: Self-pay | Admitting: Family Medicine

## 2024-07-08 DIAGNOSIS — E034 Atrophy of thyroid (acquired): Secondary | ICD-10-CM

## 2024-08-13 ENCOUNTER — Other Ambulatory Visit: Payer: Self-pay | Admitting: Family Medicine

## 2024-08-13 DIAGNOSIS — E034 Atrophy of thyroid (acquired): Secondary | ICD-10-CM

## 2024-08-18 ENCOUNTER — Other Ambulatory Visit: Payer: Self-pay | Admitting: Family Medicine

## 2024-08-18 DIAGNOSIS — E034 Atrophy of thyroid (acquired): Secondary | ICD-10-CM

## 2024-08-19 ENCOUNTER — Other Ambulatory Visit: Payer: Self-pay | Admitting: Family Medicine

## 2024-08-19 DIAGNOSIS — E034 Atrophy of thyroid (acquired): Secondary | ICD-10-CM

## 2024-08-19 DIAGNOSIS — E7800 Pure hypercholesterolemia, unspecified: Secondary | ICD-10-CM

## 2024-08-19 NOTE — Telephone Encounter (Unsigned)
 Copied from CRM #[id number]. Topic: Clinical - Medication Refill >> Aug 19, 2024 10:56 AM Darshell M wrote: Medication:  Armodafinil  50 MG tablet   Has the patient contacted their pharmacy? Yes (Agent: If no, request that the patient contact the pharmacy for the refill. If patient does not wish to contact the pharmacy document the reason why and proceed with request.) (Agent: If yes, when and what did the pharmacy advise?)  This is the patient's preferred pharmacy:  [id number] [place] - [address] AT [facility] [address]  [place] [address] Phone: [phone] Fax: [phone]  Is this the correct pharmacy for this prescription? Yes If no, delete pharmacy and type the correct one.   Has the prescription been filled recently? No  Is the patient out of the medication? Yes  Has the patient been seen for an appointment in the last year OR does the patient have an upcoming appointment? Yes  Can we respond through MyChart? No  Agent: Please be advised that Rx refills may take up to 3 business days. We ask that you follow-up with your pharmacy.

## 2024-08-19 NOTE — Telephone Encounter (Unsigned)
 Copied from CRM [id number]. Topic: Clinical - Medication Refill >> Aug 19, 2024 10:50 AM [name] wrote: Medication: atorvastatin  (LIPITOR) 20 MG tablet levothyroxine  (SYNTHROID ) 125 MCG tablet tirzepatide - sleep apnea pills (patient unsure of the name but states its his sleep apnea pills)  Patient scheduled follow up visit for 10/14/24. Requesting courtesy refills.   Has the patient contacted their pharmacy? Yes  This is the patient's preferred pharmacy:  [id number] [place], [place] - [address] AT [facility] [address] [place] [address] Phone: [phone] Fax: [phone]  Is this the correct pharmacy for this prescription? Yes  Has the prescription been filled recently? No  Is the patient out of the medication? Yes  Has the patient been seen for an appointment in the last year OR does the patient have an upcoming appointment? Yes  Can we respond through MyChart? No  Agent: Please be advised that Rx refills may take up to 3 business days. We ask that you follow-up with your pharmacy.

## 2024-08-19 NOTE — Telephone Encounter (Signed)
 Unable to pend Tirzepatide

## 2024-08-21 MED ORDER — ARMODAFINIL 50 MG PO TABS: 50.0000 mg | ORAL_TABLET | Freq: Every day | ORAL | 1 refills | Status: AC

## 2024-08-21 NOTE — Telephone Encounter (Signed)
 Too soon for refill, refilled on 08/20/24.  Requested Prescriptions  Pending Prescriptions Disp Refills   atorvastatin  (LIPITOR) 20 MG tablet 90 tablet 1    Sig: Take 1 tablet (20 mg total) by mouth daily.     Cardiovascular:  Antilipid - Statins Failed - 08/21/2024 11:32 AM      Failed - Valid encounter within last 12 months    Recent Outpatient Visits           1 year ago OSA (obstructive sleep apnea)   [place] [facility] Of [place]. [facility] Delbert Clam, MD   1 year ago OSA (obstructive sleep apnea)   [place] [facility] - [facility] Dept Of [place]. [facility] Delbert Clam, MD   1 year ago OSA (obstructive sleep apnea)   [place] [facility] - [facility] Dept Of [place]. [facility] Delbert Clam, MD   3 years ago Hypothyroidism due to acquired atrophy of thyroid    [place] [facility] [place] - [facility] Dept Of [place]. [facility] Delbert Clam, MD   4 years ago Tobacco use   [place] [facility] - A Dept Of [place]. [facility] Delbert Clam, MD              Failed - Lipid Panel in normal range within the last 12 months    Cholesterol, Total  Date Value Ref Range Status  08/15/2023 245 (H) 100 - 199 mg/dL Final   LDL Chol Calc (NIH)  Date Value Ref Range Status  08/15/2023 150 (H) 0 - 99 mg/dL Final   HDL  Date Value Ref Range Status  08/15/2023 75 >39 mg/dL Final   Triglycerides  Date Value Ref Range Status  08/15/2023 117 0 - 149 mg/dL Final         Passed - Patient is not pregnant       levothyroxine  (SYNTHROID ) 125 MCG tablet 30 tablet 0     Endocrinology:  Hypothyroid Agents Failed - 08/21/2024 11:32 AM      Failed - TSH in normal range and within 360 days    TSH  Date Value Ref Range Status  08/15/2023 7.870 (H) 0.450 - 4.500 uIU/mL Final         Failed - Valid encounter within last 12 months    Recent Outpatient Visits           1  year ago OSA (obstructive sleep apnea)   [place] [facility] Dept Of [place]. [facility] Delbert Clam, MD   1 year ago OSA (obstructive sleep apnea)   [place] [facility] - A Dept Of [place]. [facility] Delbert Clam, MD   1 year ago OSA (obstructive sleep apnea)   [place] [facility] - A Dept Of [place]. [facility] Delbert Clam, MD   3 years ago Hypothyroidism due to acquired atrophy of thyroid    [place] [facility] [place] - A Dept Of [place]. [facility] Delbert Clam, MD   4 years ago Tobacco use    [facility] [place] - A Dept Of [place]. [facility] Delbert Clam, MD

## 2024-08-21 NOTE — Telephone Encounter (Signed)
 Requested medication (s) are due for refill today - unknown  Requested medication (s) are on the active medication list -yes  Future visit scheduled -yes  Last refill: 01/01/24 #30 1RF  Notes to clinic: non delegated Rx  Requested Prescriptions  Pending Prescriptions Disp Refills   Armodafinil  50 MG tablet 30 tablet 1    Sig: Take 1 tablet (50 mg total) by mouth daily.     Not Delegated - Psychiatry:  Stimulants/ADHD Failed - 08/21/2024 11:29 AM      Failed - This refill cannot be delegated      Failed - Urine Drug Screen completed in last 360 days      Failed - Last BP in normal range    BP Readings from Last 1 Encounters:  08/15/23 (!) 148/92         Failed - Valid encounter within last 6 months    Recent Outpatient Visits           1 year ago OSA (obstructive sleep apnea)   [place] [facility] [place]. [facility] Delbert Clam, MD   1 year ago OSA (obstructive sleep apnea)   [place] [facility] [place]. [facility] Delbert Clam, MD   1 year ago OSA (obstructive sleep apnea)   [place] [facility] [place]. [facility] Delbert Clam, MD   3 years ago Hypothyroidism due to acquired atrophy of thyroid    [place] [facility] [place] [facility] [place]. [facility] Delbert Clam, MD   4 years ago Tobacco use   [place] [facility] [place] [facility] [place]. [facility] [place], Clam, MD              Passed - Last Heart Rate in normal range    Pulse Readings from Last 1 Encounters:  08/15/23 79            Requested Prescriptions  Pending Prescriptions Disp Refills   Armodafinil  50 MG tablet 30 tablet 1    Sig: Take 1 tablet (50 mg total) by mouth daily.     Not Delegated - Psychiatry:  Stimulants/ADHD Failed - 08/21/2024 11:29 AM      Failed - This refill cannot be delegated      Failed - Urine Drug Screen  completed in last 360 days      Failed - Last BP in normal range    BP Readings from Last 1 Encounters:  08/15/23 (!) 148/92         Failed - Valid encounter within last 6 months    Recent Outpatient Visits           1 year ago OSA (obstructive sleep apnea)   [place] [facility] [place]. [facility] Delbert Clam, MD   1 year ago OSA (obstructive sleep apnea)   [place] [facility] [place]. [facility] Delbert Clam, MD   1 year ago OSA (obstructive sleep apnea)   [place] [facility] [place]. [facility] Delbert Clam, MD   3 years ago Hypothyroidism due to acquired atrophy of thyroid     [facility] [place] [facility] [place]. [facility] Delbert Clam, MD   4 years ago Tobacco use  [place] [facility] [place] [facility] [place]. [facility] [place], Corrina, MD              Passed - Last Heart Rate in normal range    Pulse Readings from Last 1 Encounters:  08/15/23 79

## 2024-09-18 ENCOUNTER — Other Ambulatory Visit: Payer: Self-pay | Admitting: Family Medicine

## 2024-09-18 DIAGNOSIS — E034 Atrophy of thyroid (acquired): Secondary | ICD-10-CM

## 2024-09-18 NOTE — Telephone Encounter (Signed)
 Requested medications are due for refill today.  yes  Requested medications are on the active medications list.  yes  Last refill. 08/19/2024 #30 0 rf  Future visit scheduled.   yes  Notes to clinic.  Labs are expired.    Requested Prescriptions  Pending Prescriptions Disp Refills   levothyroxine  (SYNTHROID ) 125 MCG tablet [Pharmacy Med Name: LEVOTHYROXINE  125 MCG TABLET] 30 tablet 0    Sig: TAKE 1 TABLET BY MOUTH DAILY BEFORE BREAKFAST. PLEASE SCHEDULE AN APPT FOR MORE REFILLS.     Endocrinology:  Hypothyroid Agents Failed - 09/18/2024  3:04 PM      Failed - TSH in normal range and within 360 days    TSH  Date Value Ref Range Status  08/15/2023 7.870 (H) 0.450 - 4.500 uIU/mL Final         Failed - Valid encounter within last 12 months    Recent Outpatient Visits           1 year ago OSA (obstructive sleep apnea)   [place] [facility] [place]. [facility] Delbert Clam, MD   1 year ago OSA (obstructive sleep apnea)   [place] [facility] [place]. [facility] Delbert Clam, MD   1 year ago OSA (obstructive sleep apnea)   [place] [facility] [place]. [facility] Delbert Clam, MD   3 years ago Hypothyroidism due to acquired atrophy of thyroid    [place] [facility] [place] [facility] [place]. [facility] Delbert Clam, MD   4 years ago Tobacco use   [place] [facility] [place] [facility] [place]. [facility] Delbert Clam, MD

## 2024-10-08 ENCOUNTER — Other Ambulatory Visit: Payer: Self-pay | Admitting: Family Medicine

## 2024-10-08 DIAGNOSIS — K219 Gastro-esophageal reflux disease without esophagitis: Secondary | ICD-10-CM

## 2024-10-08 NOTE — Telephone Encounter (Signed)
" ° °  Copied from CRM #[id number]. Topic: Clinical - Medication Refill >> Oct 08, 2024 11:22 AM Larissa RAMAN wrote: Medication: olmesartan -hydrochlorothiazide  (BENICAR  HCT) 40-25 MG tablet  Has the patient contacted their pharmacy? Yes (Agent: If no, request that the patient contact the pharmacy for the refill. If patient does not wish to contact the pharmacy document the reason why and proceed with request.) (Agent: If yes, when and what did the pharmacy advise?)  This is the patient's preferred pharmacy:  [id number] - [place], [place] - [address] AT [facility] [address] [place] [address] Phone: [phone] Fax: [phone]  Is this the correct pharmacy for this prescription? Yes If no, delete pharmacy and type the correct one.   Has the prescription been filled recently? No  Is the patient out of the medication? Yes  Has the patient been seen for an appointment in the last year OR does the patient have an upcoming appointment? Yes  Can we respond through MyChart? Yes  Agent: Please be advised that Rx refills may take up to 3 business days. We ask that you follow-up with your pharmacy. "

## 2024-10-09 NOTE — Telephone Encounter (Signed)
 Requested Prescriptions  Pending Prescriptions Disp Refills   olmesartan -hydrochlorothiazide  (BENICAR  HCT) 40-25 MG tablet 30 tablet 0    Sig: Take 1 tablet by mouth daily. Must have office visit for refills     Cardiovascular: ARB + Diuretic Combos Failed - 10/09/2024 12:53 PM      Failed - K in normal range and within 180 days    Potassium  Date Value Ref Range Status  08/15/2023 4.4 3.5 - 5.2 mmol/L Final         Failed - Na in normal range and within 180 days    Sodium  Date Value Ref Range Status  08/15/2023 139 134 - 144 mmol/L Final         Failed - Cr in normal range and within 180 days    Creatinine, Ser  Date Value Ref Range Status  08/15/2023 0.94 0.76 - 1.27 mg/dL Final         Failed - eGFR is 10 or above and within 180 days    GFR calc Af Amer  Date Value Ref Range Status  03/22/2020 131 >59 mL/min/1.73 Final    Comment:    **[facility] currently reports eGFR in compliance with the current**   recommendations of the [employer]. [facility] will   update reporting as new guidelines are published from the NKF-ASN   Task force.    GFR calc non Af Amer  Date Value Ref Range Status  03/22/2020 113 >59 mL/min/1.73 Final   eGFR  Date Value Ref Range Status  08/15/2023 108 >59 mL/min/1.73 Final         Failed - Last BP in normal range    BP Readings from Last 1 Encounters:  08/15/23 (!) 148/92         Failed - Valid encounter within last 6 months    Recent Outpatient Visits           1 year ago OSA (obstructive sleep apnea)   [place] [facility] [place]. [facility] Delbert Clam, MD   1 year ago OSA (obstructive sleep apnea)   [place] [facility] [place]. [facility] Delbert Clam, MD   1 year ago OSA (obstructive sleep apnea)   [place] [facility] [place]. [facility] Delbert Clam, MD   3 years ago Hypothyroidism due to acquired  atrophy of thyroid    [place] [facility] [place] [facility] [place]. [facility] Delbert Clam, MD   4 years ago Tobacco use   [place] [facility] [place] [facility] [place]. [facility] Delbert Clam, MD              Passed - Patient is not pregnant

## 2024-10-14 ENCOUNTER — Ambulatory Visit: Admitting: Family Medicine

## 2024-10-28 ENCOUNTER — Ambulatory Visit: Admitting: Family Medicine
